# Patient Record
Sex: Female | Born: 1985 | Race: Black or African American | Hispanic: No | Marital: Married | Smoking: Never smoker
Health system: Southern US, Community
[De-identification: ages and names within clinical notes are randomized; demographics above are authoritative.]

## PROBLEM LIST (undated history)

## (undated) DIAGNOSIS — E079 Disorder of thyroid, unspecified: Secondary | ICD-10-CM

## (undated) DIAGNOSIS — Z8719 Personal history of other diseases of the digestive system: Secondary | ICD-10-CM

## (undated) DIAGNOSIS — E039 Hypothyroidism, unspecified: Secondary | ICD-10-CM

## (undated) DIAGNOSIS — I10 Essential (primary) hypertension: Secondary | ICD-10-CM

## (undated) HISTORY — DX: Morbid (severe) obesity due to excess calories: E66.01

## (undated) HISTORY — DX: Disorder of thyroid, unspecified: E07.9

## (undated) HISTORY — DX: Personal history of other diseases of the digestive system: Z87.19

## (undated) HISTORY — DX: Essential (primary) hypertension: I10

## (undated) HISTORY — PX: CHOLECYSTECTOMY: SHX55

## (undated) HISTORY — PX: OTHER SURGICAL HISTORY: SHX169

## (undated) HISTORY — PX: BARIATRIC SURGERY: SHX1103

## (undated) HISTORY — PX: HAND SURGERY: SHX662

---

## 2006-06-16 ENCOUNTER — Encounter: Payer: Self-pay | Admitting: Maternal & Fetal Medicine

## 2006-07-14 ENCOUNTER — Encounter: Payer: Self-pay | Admitting: Maternal & Fetal Medicine

## 2006-09-26 ENCOUNTER — Observation Stay: Payer: Self-pay

## 2007-01-01 ENCOUNTER — Inpatient Hospital Stay: Payer: Self-pay | Admitting: Unknown Physician Specialty

## 2007-08-25 ENCOUNTER — Emergency Department: Payer: Self-pay | Admitting: Internal Medicine

## 2007-10-09 ENCOUNTER — Ambulatory Visit: Payer: Self-pay | Admitting: General Surgery

## 2007-10-13 ENCOUNTER — Ambulatory Visit: Payer: Self-pay | Admitting: General Surgery

## 2011-05-22 ENCOUNTER — Emergency Department: Payer: Self-pay | Admitting: Emergency Medicine

## 2012-05-06 ENCOUNTER — Encounter: Payer: Self-pay | Admitting: Cardiovascular Disease

## 2012-05-08 ENCOUNTER — Ambulatory Visit (INDEPENDENT_AMBULATORY_CARE_PROVIDER_SITE_OTHER): Payer: BC Managed Care – PPO | Admitting: Cardiovascular Disease

## 2012-05-08 ENCOUNTER — Encounter: Payer: Self-pay | Admitting: Cardiovascular Disease

## 2012-05-08 VITALS — BP 142/92 | HR 77 | Ht 63.0 in | Wt 303.8 lb

## 2012-05-08 DIAGNOSIS — R011 Cardiac murmur, unspecified: Secondary | ICD-10-CM | POA: Insufficient documentation

## 2012-05-08 DIAGNOSIS — E669 Obesity, unspecified: Secondary | ICD-10-CM

## 2012-05-08 DIAGNOSIS — I10 Essential (primary) hypertension: Secondary | ICD-10-CM

## 2012-05-08 HISTORY — DX: Morbid (severe) obesity due to excess calories: E66.01

## 2012-05-08 NOTE — Assessment & Plan Note (Signed)
We have encouraged continued exercise, careful diet management in an effort to lose weight. 

## 2012-05-08 NOTE — Assessment & Plan Note (Signed)
She is concerned about hypertrophic cardiomyopathy. Other relatives have been diagnosed with this. Many of whom have passed away or have defibrillator placed. We have ordered an echocardiogram to exclude this diagnosis.

## 2012-05-08 NOTE — Progress Notes (Signed)
Patient ID: Deborah Munoz, female    DOB: 12-Aug-1986, 26 y.o.   MRN: [id number]  HPI Comments: Deborah Munoz is a very pleasant 76 her old woman with a history of hypertension, hypothyroidism, obesity, who is a patient of Dr. Elnita Maxwell Whicker/Dr. Dossie Arbour, who presents for evaluation of possible HOCM.  She reports that she has a strong family history of hypertrophic cardiomyopathy. 2 of her aunts died at an early age, one in her late 43s, one in her early 62s. Her mothers brother was diagnosed with cardiomyopathy and had a defibrillator placed. In addition, her father died at age 4, grandmother had heart attack at an early age.   She reports that her blood pressure at home is typically in the 130 range over 70. No significant shortness of breath or edema. She has no new health issues. She is caring for a 27-year-old child and a new 3-month-old child. No significant complications during pregnancy.  Notes indicate history of gallstones.  Edema in 2006 and was started on a diuretic at that time. Severe abdominal pain in December 2008 indicating cholecystectomy by laparoscopic means.  EKG shows normal sinus rhythm with rate 77 beats per minute with rare APC   Outpatient Encounter Prescriptions as of 05/08/2012  Medication Sig Dispense Refill  . hydrochlorothiazide (HYDRODIURIL) 25 MG tablet Take 25 mg by mouth daily.      Marland Kitchen levothyroxine (SYNTHROID, LEVOTHROID) 100 MCG tablet Take 100 mcg by mouth daily.      . metoprolol succinate (TOPROL-XL) 25 MG 24 hr tablet Take 25 mg by mouth daily.      . Multiple Vitamin (MULTIVITAMIN) tablet Take 1 tablet by mouth daily.         Review of Systems  Constitutional: Negative.   HENT: Negative.   Eyes: Negative.   Respiratory: Negative.   Cardiovascular: Positive for leg swelling.  Gastrointestinal: Negative.   Musculoskeletal: Negative.   Skin: Negative.   Neurological: Negative.   Hematological: Negative.   Psychiatric/Behavioral: Negative.   All  other systems reviewed and are negative.    BP 142/92  Pulse 77  Ht 5\' 3"  (1.6 m)  Wt 303 lb 12 oz (137.78 kg)  BMI 53.81 kg/m2  Physical Exam  Nursing note and vitals reviewed. Constitutional: She is oriented to person, place, and time. She appears well-developed and well-nourished.  HENT:  Head: Normocephalic.  Nose: Nose normal.  Mouth/Throat: Oropharynx is clear and moist.  Eyes: Conjunctivae are normal. Pupils are equal, round, and reactive to light.  Neck: Normal range of motion. Neck supple. No JVD present.  Cardiovascular: Normal rate, regular rhythm, S1 normal, S2 normal and intact distal pulses.  Exam reveals no gallop and no friction rub.   Murmur heard.  Systolic murmur is present with a grade of 1/6  Pulmonary/Chest: Effort normal and breath sounds normal. No respiratory distress. She has no wheezes. She has no rales. She exhibits no tenderness.  Abdominal: Soft. Bowel sounds are normal. She exhibits no distension. There is no tenderness.  Musculoskeletal: Normal range of motion. She exhibits no edema and no tenderness.  Lymphadenopathy:    She has no cervical adenopathy.  Neurological: She is alert and oriented to person, place, and time. Coordination normal.  Skin: Skin is warm and dry. No rash noted. No erythema.  Psychiatric: She has a normal mood and affect. Her behavior is normal. Judgment and thought content normal.         Assessment and Plan

## 2012-05-08 NOTE — Patient Instructions (Addendum)
You are doing well. No medication changes were made.  We will schedule you for an echocardiogram   Please call us if you have new issues that need to be addressed before your next appt.

## 2012-05-08 NOTE — Assessment & Plan Note (Signed)
Improved edema on her diuretic. Also on beta blocker with adequate blood pressure.

## 2012-05-12 ENCOUNTER — Other Ambulatory Visit: Payer: BC Managed Care – PPO

## 2012-06-02 ENCOUNTER — Other Ambulatory Visit (INDEPENDENT_AMBULATORY_CARE_PROVIDER_SITE_OTHER): Payer: BC Managed Care – PPO

## 2012-06-02 ENCOUNTER — Other Ambulatory Visit: Payer: Self-pay

## 2012-06-02 DIAGNOSIS — I10 Essential (primary) hypertension: Secondary | ICD-10-CM

## 2012-06-02 DIAGNOSIS — R011 Cardiac murmur, unspecified: Secondary | ICD-10-CM

## 2013-09-27 ENCOUNTER — Ambulatory Visit: Payer: Self-pay | Admitting: Hematology and Oncology

## 2013-09-27 ENCOUNTER — Ambulatory Visit: Payer: Self-pay | Admitting: Internal Medicine

## 2013-09-27 LAB — CBC CANCER CENTER
Basophil #: 0.1 x10 3/mm (ref 0.0–0.1)
Basophil %: 0.7 %
EOS ABS: 0.3 x10 3/mm (ref 0.0–0.7)
EOS PCT: 4.1 %
HCT: 38 % (ref 35.0–47.0)
HGB: 12.3 g/dL (ref 12.0–16.0)
LYMPHS ABS: 1.9 x10 3/mm (ref 1.0–3.6)
LYMPHS PCT: 24.1 %
MCH: 24.1 pg — AB (ref 26.0–34.0)
MCHC: 32.3 g/dL (ref 32.0–36.0)
MCV: 75 fL — ABNORMAL LOW (ref 80–100)
Monocyte #: 0.6 x10 3/mm (ref 0.2–0.9)
Monocyte %: 7.9 %
Neutrophil #: 4.9 x10 3/mm (ref 1.4–6.5)
Neutrophil %: 63.2 %
PLATELETS: 341 x10 3/mm (ref 150–440)
RBC: 5.1 10*6/uL (ref 3.80–5.20)
RDW: 15.3 % — ABNORMAL HIGH (ref 11.5–14.5)
WBC: 7.7 x10 3/mm (ref 3.6–11.0)

## 2013-10-24 ENCOUNTER — Ambulatory Visit: Payer: Self-pay | Admitting: Internal Medicine

## 2015-03-24 ENCOUNTER — Ambulatory Visit: Payer: Self-pay | Admitting: Family Medicine

## 2015-03-29 ENCOUNTER — Ambulatory Visit (INDEPENDENT_AMBULATORY_CARE_PROVIDER_SITE_OTHER): Payer: BC Managed Care – PPO | Admitting: Family Medicine

## 2015-03-29 ENCOUNTER — Encounter: Payer: Self-pay | Admitting: Family Medicine

## 2015-03-29 VITALS — BP 132/85 | HR 89 | Temp 99.3°F | Wt 289.0 lb

## 2015-03-29 DIAGNOSIS — F909 Attention-deficit hyperactivity disorder, unspecified type: Secondary | ICD-10-CM

## 2015-03-29 DIAGNOSIS — I10 Essential (primary) hypertension: Secondary | ICD-10-CM | POA: Diagnosis not present

## 2015-03-29 DIAGNOSIS — F900 Attention-deficit hyperactivity disorder, predominantly inattentive type: Secondary | ICD-10-CM | POA: Diagnosis not present

## 2015-03-29 DIAGNOSIS — D239 Other benign neoplasm of skin, unspecified: Secondary | ICD-10-CM | POA: Diagnosis not present

## 2015-03-29 NOTE — Assessment & Plan Note (Signed)
Benign.   monitor.

## 2015-03-29 NOTE — Progress Notes (Signed)
BP 132/85 mmHg  Pulse 89  Temp(Src) 99.3 F (37.4 C)  Wt 289 lb (131.09 kg)  SpO2 97%  LMP 03/07/2015 (Exact Date)   Subjective:    Patient ID: Deborah Munoz, female    DOB: 1985-11-01, 29 y.o.   MRN: [id number]  HPI: Deborah Munoz is a 29 y.o. female  Chief Complaint  Patient presents with  . OTHER    mole on her back  . ADHD    starting back school in fall, history of it in the past    Place on the upper back; 3-4 months; not getting larger; not painful; no discharge; has a skin tag on the left side neck, fallen off and grew back  Blood pressure today was high; checks once a month or so [facility]; usually pretty good; thinks it was high because she was slinging boxes all day  She is going to study psychology in the fall; [employer] for this semester and then will decide where to finish; she used to take Strattera; wonders if there is anything; she was on 80 mg in 2011 and 2013 for ADHD; her son has ADHD; patient is not very organized  She wants her uterus taken out; she was told by her gynecologist that she can't have a hysterectomy for contraception  Relevant past medical, surgical, family and social history reviewed and updated as indicated. Interim medical history since our last visit reviewed. Allergies and medications reviewed and updated.  Review of Systems  Per HPI unless specifically indicated above     Objective:    BP 132/85 mmHg  Pulse 89  Temp(Src) 99.3 F (37.4 C)  Wt 289 lb (131.09 kg)  SpO2 97%  LMP 03/07/2015 (Exact Date)  Wt Readings from Last 3 Encounters:  03/29/15 289 lb (131.09 kg)  05/08/12 303 lb 12 oz (137.78 kg)   Body mass index is 51.21 kg/(m^2).  Physical Exam  Constitutional: She appears well-developed and well-nourished.  Morbidly obese  Cardiovascular: Normal rate and regular rhythm.   Pulmonary/Chest: Effort normal and breath sounds normal.  Neurological: She is alert.  Skin: Skin is warm and dry.  Firm fibrous 5 mm nummular  papular lesion upper back right side; hyperpigmented; slight puckering in center when pressed from the sides    Results for orders placed or performed in visit on 09/27/13  [place]  Result Value Ref Range   WBC 7.7 3.6-11.0 x10 3/mm    RBC 5.10 3.80-5.20 x10 6/mm    HGB 12.3 12.0-16.0 g/dL   HCT 38.0 35.0-47.0 %   MCV 75 (L) 80-100 fL   MCH 24.1 (L) 26.0-34.0 pg   MCHC 32.3 32.0-36.0 g/dL   RDW 15.3 (H) 11.5-14.5 %   Platelet 341 150-440 x10 3/mm    Neutrophil % 63.2 %   Lymphocyte % 24.1 %   Monocyte % 7.9 %   Eosinophil % 4.1 %   Basophil % 0.7 %   Neutrophil # 4.9 1.4-6.5 x10 3/mm    Lymphocyte # 1.9 1.0-3.6 x10 3/mm    Monocyte # 0.6 0.2-0.9 x10 3/mm    Eosinophil # 0.3 0.0-0.7 x10 3/mm    Basophil # 0.1 0.0-0.1 x10 3/mm       Assessment & Plan:   Problem List Items Addressed This Visit      Cardiovascular and Mediastinum   Hypertension    Recheck BP 132/85; better but not ideal; discussed risk of ADHD meds; will have her try weight loss and  DASH guidelines        Musculoskeletal and Integument   Dermatofibroma - Primary    Benign; monitor        Other   Morbid obesity    Continue to work with personal trainer, smart eating, activity      Adult ADHD    Per patient report and prior records; I did not do full history today; explained risks associated with stimulant meds such as heart attack and stroke; would rather her read [place] and [place] and work with life coach; she will return in one month if she still wants to consider medicine          Follow up plan: Return in about 1 month (around 04/29/2015) for adhd.

## 2015-03-29 NOTE — Assessment & Plan Note (Signed)
Recheck BP 132/85; better but not ideal; discussed risk of ADHD meds; will have her try weight loss and DASH guidelines

## 2015-03-29 NOTE — Assessment & Plan Note (Signed)
Continue to work with Physiological scientist, smart eating, activity

## 2015-03-29 NOTE — Assessment & Plan Note (Signed)
Per patient report and prior records; I did not do full history today; explained risks associated with stimulant meds such as heart attack and stroke; would rather her read [place] and [place] and work with life coach; she will return in one month if she still wants to consider medicine

## 2015-03-29 NOTE — Patient Instructions (Addendum)
I'll encourage you to read Answers to Distraction by [facility] and [place] before we consider medication Try O.H.I.O = only handle it once Try Mozart or Vivaldi Check out a coach on Psychology Today Let's keep an eye on the mole and if it grows or changes or gets painful, let's remove that Keep working with your personal trainer and eating well Your goal blood pressure is less than 120/80 ideally, but at least needs to be under 140/90 Try to follow the DASH guidelines (DASH stands for Dietary Approaches to Stop Hypertension) Try to limit the sodium in your diet.  Ideally, consume less than 1.5 grams (less than 1,500mg ) per day. Do not add salt when cooking or at the table.  Check the sodium amount on labels when shopping, and choose items lower in sodium when given a choice. Avoid or limit foods that already contain a lot of sodium. Eat a diet rich in fruits and vegetables and whole grains.  DASH Eating Plan DASH stands for "Dietary Approaches to Stop Hypertension." The DASH eating plan is a healthy eating plan that has been shown to reduce high blood pressure (hypertension). Additional health benefits may include reducing the risk of type 2 diabetes mellitus, heart disease, and stroke. The DASH eating plan may also help with weight loss. WHAT DO I NEED TO KNOW ABOUT THE DASH EATING PLAN? For the DASH eating plan, you will follow these general guidelines:  Choose foods with a percent daily value for sodium of less than 5% (as listed on the food label).  Use salt-free seasonings or herbs instead of table salt or sea salt.  Check with your health care provider or pharmacist before using salt substitutes.  Eat lower-sodium products, often labeled as "lower sodium" or "no salt added."  Eat fresh foods.  Eat more vegetables, fruits, and low-fat dairy products.  Choose whole grains. Look for the word "whole" as the first word in the ingredient list.  Choose fish and skinless chicken or [place]  more often than red meat. Limit fish, poultry, and meat to 6 oz (170 g) each day.  Limit sweets, desserts, sugars, and sugary drinks.  Choose heart-healthy fats.  Limit cheese to 1 oz (28 g) per day.  Eat more home-cooked food and less restaurant, buffet, and fast food.  Limit fried foods.  Cook foods using methods other than frying.  Limit canned vegetables. If you do use them, rinse them well to decrease the sodium.  When eating at a restaurant, ask that your food be prepared with less salt, or no salt if possible. WHAT FOODS CAN I EAT? Seek help from a dietitian for individual calorie needs. Grains Whole grain or whole wheat bread. Brown rice. Whole grain or whole wheat pasta. Quinoa, bulgur, and whole grain cereals. Low-sodium cereals. Corn or whole wheat flour tortillas. Whole grain cornbread. Whole grain crackers. Low-sodium crackers. Vegetables Fresh or frozen vegetables (raw, steamed, roasted, or grilled). Low-sodium or reduced-sodium tomato and vegetable juices. Low-sodium or reduced-sodium tomato sauce and paste. Low-sodium or reduced-sodium canned vegetables.  Fruits All fresh, canned (in natural juice), or frozen fruits. Meat and Other Protein Products Ground beef (85% or leaner), grass-fed beef, or beef trimmed of fat. Skinless chicken or [place]. Ground chicken or [place]. Pork trimmed of fat. All fish and seafood. Eggs. Dried beans, peas, or lentils. Unsalted nuts and seeds. Unsalted canned beans. Dairy Low-fat dairy products, such as skim or 1% milk, 2% or reduced-fat cheeses, low-fat ricotta or cottage cheese, or plain low-fat  yogurt. Low-sodium or reduced-sodium cheeses. Fats and Oils Tub margarines without trans fats. Light or reduced-fat mayonnaise and salad dressings (reduced sodium). Avocado. Safflower, olive, or canola oils. Natural peanut or almond butter. Other Unsalted popcorn and pretzels. The items listed above may not be a complete list of recommended foods  or beverages. Contact your dietitian for more options. WHAT FOODS ARE NOT RECOMMENDED? Grains White bread. White pasta. White rice. Refined cornbread. Bagels and croissants. Crackers that contain trans fat. Vegetables Creamed or fried vegetables. Vegetables in a cheese sauce. Regular canned vegetables. Regular canned tomato sauce and paste. Regular tomato and vegetable juices. Fruits Dried fruits. Canned fruit in light or heavy syrup. Fruit juice. Meat and Other Protein Products Fatty cuts of meat. Ribs, chicken wings, bacon, sausage, bologna, salami, chitterlings, fatback, hot dogs, bratwurst, and packaged luncheon meats. Salted nuts and seeds. Canned beans with salt. Dairy Whole or 2% milk, cream, half-and-half, and cream cheese. Whole-fat or sweetened yogurt. Full-fat cheeses or blue cheese. Nondairy creamers and whipped toppings. Processed cheese, cheese spreads, or cheese curds. Condiments Onion and garlic salt, seasoned salt, table salt, and sea salt. Canned and packaged gravies. Worcestershire sauce. Tartar sauce. Barbecue sauce. Teriyaki sauce. Soy sauce, including reduced sodium. Steak sauce. Fish sauce. Oyster sauce. Cocktail sauce. Horseradish. Ketchup and mustard. Meat flavorings and tenderizers. Bouillon cubes. Hot sauce. Tabasco sauce. Marinades. Taco seasonings. Relishes. Fats and Oils Butter, stick margarine, lard, shortening, ghee, and bacon fat. Coconut, palm kernel, or palm oils. Regular salad dressings. Other Pickles and olives. Salted popcorn and pretzels. The items listed above may not be a complete list of foods and beverages to avoid. Contact your dietitian for more information. WHERE CAN I FIND MORE INFORMATION? National Heart, Lung, and Blood Institute: travelstabloid.com Document Released: 08/29/2011 Document Revised: 01/24/2014 Document Reviewed: 07/14/2013 [facility] Patient Information 2015 [place], [place]. This information is not  intended to replace advice given to you by your health care provider. Make sure you discuss any questions you have with your health care provider.

## 2015-05-04 ENCOUNTER — Ambulatory Visit (INDEPENDENT_AMBULATORY_CARE_PROVIDER_SITE_OTHER): Payer: BC Managed Care – PPO | Admitting: Family Medicine

## 2015-05-04 ENCOUNTER — Encounter: Payer: Self-pay | Admitting: Family Medicine

## 2015-05-04 VITALS — BP 132/85 | HR 97 | Temp 98.8°F | Wt 290.0 lb

## 2015-05-04 DIAGNOSIS — Z0000 Encounter for general adult medical examination without abnormal findings: Secondary | ICD-10-CM | POA: Diagnosis not present

## 2015-05-04 DIAGNOSIS — F909 Attention-deficit hyperactivity disorder, unspecified type: Secondary | ICD-10-CM

## 2015-05-04 DIAGNOSIS — E559 Vitamin D deficiency, unspecified: Secondary | ICD-10-CM

## 2015-05-04 DIAGNOSIS — F900 Attention-deficit hyperactivity disorder, predominantly inattentive type: Secondary | ICD-10-CM

## 2015-05-04 DIAGNOSIS — R718 Other abnormality of red blood cells: Secondary | ICD-10-CM | POA: Diagnosis not present

## 2015-05-04 DIAGNOSIS — R946 Abnormal results of thyroid function studies: Secondary | ICD-10-CM

## 2015-05-04 DIAGNOSIS — I10 Essential (primary) hypertension: Secondary | ICD-10-CM

## 2015-05-04 DIAGNOSIS — E039 Hypothyroidism, unspecified: Secondary | ICD-10-CM | POA: Insufficient documentation

## 2015-05-04 NOTE — Patient Instructions (Signed)
We'll check labs today and contact you with the results Do contact the foundation about your question Keep working on the tips and tricks for focus Return in 3-6 months

## 2015-05-04 NOTE — Progress Notes (Signed)
BP 132/85 mmHg  Pulse 97  Temp(Src) 98.8 F (37.1 C)  Wt 290 lb (131.543 kg)  SpO2 97%  LMP  (LMP Unknown)   Subjective:    Patient ID: Deborah Munoz, female    DOB: 12/19/1985, 29 y.o.   MRN: [id number]  HPI: Deborah Munoz is a 29 y.o. female  Chief Complaint  Patient presents with  . ADHD  . 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 Interim medical history since our last visit reviewed. Allergies and medications reviewed and updated.  Review of Systems Per HPI unless specifically indicated above     Objective:    BP 132/85 mmHg  Pulse 97  Temp(Src) 98.8 F (37.1 C)  Wt 290 lb (131.543 kg)  SpO2 97%  LMP  (LMP Unknown)  Wt Readings from Last 3 Encounters:  05/04/15 290 lb (131.543 kg)  03/29/15 289 lb (131.09 kg)  05/08/12 303 lb 12 oz (137.78 kg)    Physical Exam  Constitutional: She appears well-developed and well-nourished. No distress.  Eyes: EOM are normal. No  scleral icterus.  Neck: No thyromegaly present.  Cardiovascular: Normal rate.   Pulmonary/Chest: Effort normal.  Abdominal: She exhibits no distension.  Skin: No pallor.  Psychiatric: She has a normal mood and affect. Her behavior is normal. Judgment and thought content normal.      Assessment & Plan:   Problem List Items Addressed This Visit      Cardiovascular and Mediastinum   Hypertension - Primary    Fair control today; weight loss would be helpful        Other   Morbid obesity    Patient has struggled with weight; has previously worked with Physiological scientist, but work demands have kept her from this lately; hopeful she will get back in the swing with diet and exercise      Adult ADHD    I am hopeful that she'll be able to use some strategies to help cope with attention difficulties rather than use stimulants at this time; as she loses weight and gets her blood pressure down, stimulant use might be something we can readdress      Abnormal thyroid function test    Check TSH, adjust med if needed      Relevant Orders   TSH (Completed)   Vitamin D deficiency    Check level today, supplement if needed  Relevant Orders   Vit D  25 hydroxy (rtn osteoporosis monitoring) (Completed)   RBC microcytosis    Check cbc and ferritin      Relevant Orders   CBC (Completed)   Ferritin (Completed)    Other Visit Diagnoses    Preventative health care        labs for health maintenance collected as well today; no physical exam done though, just labs ordered    Relevant Orders    Comprehensive metabolic panel (Completed)       We discussed her desire to be a uterine donor; I gave her info to [place] group to see if they can provide her on relevant information on research, donation, other questions she has  Follow up plan: No Follow-up on file.  Left it up to her, either 3 or 6 month follow-up; sooner f/u if she thinks it would hold her accountable for weight loss; 6 months  for hypertension, multiple other issues

## 2015-05-05 ENCOUNTER — Encounter: Payer: Self-pay | Admitting: Family Medicine

## 2015-05-05 LAB — CBC
Hematocrit: 38.3 % (ref 34.0–46.6)
Hemoglobin: 12.9 g/dL (ref 11.1–15.9)
MCH: 24.8 pg — ABNORMAL LOW (ref 26.6–33.0)
MCHC: 33.7 g/dL (ref 31.5–35.7)
MCV: 74 fL — AB (ref 79–97)
Platelets: 356 10*3/uL (ref 150–379)
RBC: 5.21 x10E6/uL (ref 3.77–5.28)
RDW: 15.1 % (ref 12.3–15.4)
WBC: 9.7 10*3/uL (ref 3.4–10.8)

## 2015-05-05 LAB — COMPREHENSIVE METABOLIC PANEL
A/G RATIO: 1.4 (ref 1.1–2.5)
ALBUMIN: 4.2 g/dL (ref 3.5–5.5)
ALT: 9 IU/L (ref 0–32)
AST: 12 IU/L (ref 0–40)
Alkaline Phosphatase: 66 IU/L (ref 39–117)
BUN/Creatinine Ratio: 20 (ref 8–20)
BUN: 15 mg/dL (ref 6–20)
Bilirubin Total: <0.2 mg/dL (ref 0.0–1.2)
CALCIUM: 8.9 mg/dL (ref 8.7–10.2)
CHLORIDE: 96 mmol/L — AB (ref 97–108)
CO2: 25 mmol/L (ref 18–29)
Creatinine, Ser: 0.74 mg/dL (ref 0.57–1.00)
GFR, EST AFRICAN AMERICAN: 127 mL/min/{1.73_m2} (ref >59)
GFR, EST NON AFRICAN AMERICAN: 110 mL/min/{1.73_m2} (ref >59)
GLOBULIN, TOTAL: 2.9 g/dL (ref 1.5–4.5)
GLUCOSE: 91 mg/dL (ref 65–99)
POTASSIUM: 3.7 mmol/L (ref 3.5–5.2)
Sodium: 136 mmol/L (ref 134–144)
Total Protein: 7.1 g/dL (ref 6.0–8.5)

## 2015-05-05 LAB — VITAMIN D 25 HYDROXY (VIT D DEFICIENCY, FRACTURES): Vit D, 25-Hydroxy: 33.3 ng/mL (ref 30.0–100.0)

## 2015-05-05 LAB — TSH: TSH: 2.52 u[IU]/mL (ref 0.450–4.500)

## 2015-05-05 LAB — FERRITIN: FERRITIN: 178 ng/mL — AB (ref 15–150)

## 2015-05-06 DIAGNOSIS — R718 Other abnormality of red blood cells: Secondary | ICD-10-CM | POA: Insufficient documentation

## 2015-05-06 NOTE — Assessment & Plan Note (Signed)
Check TSH, adjust med if needed

## 2015-05-06 NOTE — Assessment & Plan Note (Signed)
Fair control today; weight loss would be helpful

## 2015-05-06 NOTE — Assessment & Plan Note (Signed)
I am hopeful that she'll be able to use some strategies to help cope with attention difficulties rather than use stimulants at this time; as she loses weight and gets her blood pressure down, stimulant use might be something we can readdress

## 2015-05-06 NOTE — Assessment & Plan Note (Signed)
Check level today, supplement if needed 

## 2015-05-06 NOTE — Assessment & Plan Note (Signed)
Patient has struggled with weight; has previously worked with Physiological scientist, but work demands have kept her from this lately; hopeful she will get back in the swing with diet and exercise

## 2015-05-06 NOTE — Assessment & Plan Note (Signed)
Check cbc and ferritin 

## 2015-06-13 ENCOUNTER — Telehealth: Payer: Self-pay

## 2015-06-13 DIAGNOSIS — R0683 Snoring: Secondary | ICD-10-CM

## 2015-06-13 DIAGNOSIS — R400 Somnolence: Secondary | ICD-10-CM

## 2015-06-13 DIAGNOSIS — E669 Obesity, unspecified: Secondary | ICD-10-CM

## 2015-06-13 DIAGNOSIS — R29818 Other symptoms and signs involving the nervous system: Secondary | ICD-10-CM

## 2015-06-13 DIAGNOSIS — G479 Sleep disorder, unspecified: Secondary | ICD-10-CM

## 2015-06-13 NOTE — Telephone Encounter (Signed)
Please add symptoms to problem list, link to referral, and send; I entered referral to pulm (sleep specialist) but didn't have symptoms (snoring, daytime fatigue, daytime somnolence, headaches, witnessed apnea, etc.) Just list those, link, and route; thanks!

## 2015-06-13 NOTE — Telephone Encounter (Signed)
Patient notified. She did feeling great checklist. She marked 12 out of 14 yes. She falls asleep while driving, never feels refreshed, falls asleep easily watching TV, snoring, obesity.

## 2015-06-13 NOTE — Telephone Encounter (Signed)
Patient would like a referral to a sleep specialist for a sleep study to eval for sleep apnea.

## 2015-07-20 DIAGNOSIS — G4733 Obstructive sleep apnea (adult) (pediatric): Secondary | ICD-10-CM | POA: Insufficient documentation

## 2015-08-08 ENCOUNTER — Ambulatory Visit: Payer: BC Managed Care – PPO | Attending: Specialist

## 2015-08-08 DIAGNOSIS — G4733 Obstructive sleep apnea (adult) (pediatric): Secondary | ICD-10-CM | POA: Insufficient documentation

## 2015-08-16 ENCOUNTER — Ambulatory Visit (INDEPENDENT_AMBULATORY_CARE_PROVIDER_SITE_OTHER): Payer: BC Managed Care – PPO | Admitting: Family Medicine

## 2015-08-16 ENCOUNTER — Encounter: Payer: Self-pay | Admitting: Family Medicine

## 2015-08-16 VITALS — BP 125/81 | HR 92 | Temp 98.6°F | Ht 63.5 in | Wt 290.0 lb

## 2015-08-16 DIAGNOSIS — S29011A Strain of muscle and tendon of front wall of thorax, initial encounter: Secondary | ICD-10-CM

## 2015-08-16 MED ORDER — CYCLOBENZAPRINE HCL 10 MG PO TABS: 10.0000 mg | ORAL_TABLET | Freq: Three times a day (TID) | ORAL | Status: DC | PRN

## 2015-08-16 MED ORDER — MELOXICAM 15 MG PO TABS: 15.0000 mg | ORAL_TABLET | Freq: Every day | ORAL | Status: DC

## 2015-08-16 NOTE — Progress Notes (Signed)
BP 125/81 mmHg  Pulse 92  Temp(Src) 98.6 F (37 C)  Ht 5' 3.5" (1.613 m)  Wt 290 lb (131.543 kg)  BMI 50.56 kg/m2  SpO2 97%  LMP  (LMP Unknown)   Subjective:    Patient ID: Deborah Munoz, female    DOB: 19-Apr-1986, 29 y.o.   MRN: [id number]  HPI: Deborah Munoz is a 29 y.o. female  Chief Complaint  Patient presents with  . Back Pain    mid back, all the way across. pain has eased some   patient this morning was doing well using the restroom just after getting up from bed with no complaints or problems on standing from the toilet had severe mid back pain grab her with a sharp catch and severe pain. This is not gone up or down stairs, all across her back into the sides patient had a mild cold but nothing special.  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   Relevant past medical, surgical, family and social history reviewed and updated as indicated. Interim medical history since our last visit reviewed. Allergies and medications reviewed and updated.  Review of Systems  Constitutional: Negative.   Respiratory: Negative.   Cardiovascular: Negative.     Per HPI unless specifically indicated above     Objective:    BP 125/81 mmHg  Pulse 92  Temp(Src) 98.6 F (37 C)  Ht 5' 3.5" (1.613 m)  Wt 290 lb (131.543 kg)  BMI 50.56 kg/m2  SpO2 97%  LMP  (LMP Unknown)  Wt Readings from Last 3 Encounters:  08/16/15 290 lb (131.543 kg)  05/04/15 290 lb (131.543 kg)  03/29/15 289 lb (131.09 kg)    Physical Exam  Constitutional: She is oriented to person, place, and time. She appears well-developed and well-nourished. No distress.  HENT:  Head: Normocephalic and atraumatic.  Right Ear: Hearing normal.  Left Ear: Hearing normal.  Nose: Nose normal.  Eyes: Conjunctivae and lids are normal. Right eye exhibits no discharge. Left eye exhibits no discharge. No  scleral icterus.  Cardiovascular: Normal rate, regular rhythm and normal heart sounds.   Pulmonary/Chest: Effort normal and breath sounds normal. No respiratory distress.  Musculoskeletal: Normal range of motion.  Mid back area with soreness with twisting range of motion no spinal irritation inflammation or pain  Neurological: She is alert and oriented to person, place, and time.  Skin: Skin is intact. No rash noted.  Psychiatric: She has a normal mood and affect. Her speech is normal and behavior is normal. Judgment and thought content normal. Cognition and memory are normal.    Results for orders placed or performed in visit on 05/04/15  Vit D  25 hydroxy (rtn osteoporosis monitoring)  Result Value Ref Range   Vit D, 25-Hydroxy 33.3 30.0 - 100.0 ng/mL  TSH  Result Value Ref Range   TSH 2.520 0.450 - 4.500 uIU/mL  Comprehensive metabolic panel  Result Value Ref Range   Glucose 91 65 - 99 mg/dL   BUN 15 6 - 20 mg/dL   Creatinine, Ser 0.74 0.57 - 1.00 mg/dL   GFR calc non Af Amer 110 >59 mL/min/1.73   GFR calc Af Amer 127 >59 mL/min/1.73   BUN/Creatinine Ratio 20 8 - 20   Sodium 136 134 - 144 mmol/L   Potassium 3.7 3.5 - 5.2 mmol/L   Chloride 96 (L) 97 -  108 mmol/L   CO2 25 18 - 29 mmol/L   Calcium 8.9 8.7 - 10.2 mg/dL   Total Protein 7.1 6.0 - 8.5 g/dL   Albumin 4.2 3.5 - 5.5 g/dL   Globulin, Total 2.9 1.5 - 4.5 g/dL   Albumin/Globulin Ratio 1.4 1.1 - 2.5   Bilirubin Total <0.2 0.0 - 1.2 mg/dL   Alkaline Phosphatase 66 39 - 117 IU/L   AST 12 0 - 40 IU/L   ALT 9 0 - 32 IU/L  CBC  Result Value Ref Range   WBC 9.7 3.4 - 10.8 x10E3/uL   RBC 5.21 3.77 - 5.28 x10E6/uL   Hemoglobin 12.9 11.1 - 15.9 g/dL   Hematocrit 38.3 34.0 - 46.6 %   MCV 74 (L) 79 - 97 fL   MCH 24.8 (L) 26.6 - 33.0 pg   MCHC 33.7 31.5 - 35.7 g/dL   RDW 15.1 12.3 - 15.4 %   Platelets 356 150 - 379 x10E3/uL  Ferritin  Result Value Ref Range   Ferritin 178 (H) 15 - 150 ng/mL      Assessment & Plan:    Problem List Items Addressed This Visit      Musculoskeletal and Integument   Muscle strain of chest wall - Primary    Discussed stretching exercises proper posture Caution with use of Flexeril and drowsiness Prescription for meloxicam given If problems persist will need follow-up with Dr. Wynetta Emery.          Follow up plan: Return if symptoms worsen or fail to improve, for As scheduled.

## 2015-08-16 NOTE — Assessment & Plan Note (Signed)
Discussed stretching exercises proper posture Caution with use of Flexeril and drowsiness Prescription for meloxicam given If problems persist will need follow-up with Dr. Wynetta Emery.

## 2015-09-07 ENCOUNTER — Encounter: Payer: Self-pay | Admitting: Family Medicine

## 2015-09-07 ENCOUNTER — Ambulatory Visit (INDEPENDENT_AMBULATORY_CARE_PROVIDER_SITE_OTHER): Payer: BC Managed Care – PPO | Admitting: Family Medicine

## 2015-09-07 VITALS — BP 131/83 | HR 77 | Temp 98.2°F | Wt 290.0 lb

## 2015-09-07 DIAGNOSIS — E039 Hypothyroidism, unspecified: Secondary | ICD-10-CM

## 2015-09-07 DIAGNOSIS — G4710 Hypersomnia, unspecified: Secondary | ICD-10-CM | POA: Diagnosis not present

## 2015-09-07 DIAGNOSIS — R718 Other abnormality of red blood cells: Secondary | ICD-10-CM | POA: Diagnosis not present

## 2015-09-07 DIAGNOSIS — I10 Essential (primary) hypertension: Secondary | ICD-10-CM | POA: Diagnosis not present

## 2015-09-07 DIAGNOSIS — R400 Somnolence: Secondary | ICD-10-CM | POA: Insufficient documentation

## 2015-09-07 DIAGNOSIS — E559 Vitamin D deficiency, unspecified: Secondary | ICD-10-CM | POA: Diagnosis not present

## 2015-09-07 MED ORDER — LEVOTHYROXINE SODIUM 112 MCG PO TABS: 112.0000 ug | ORAL_TABLET | Freq: Every day | ORAL | Status: DC

## 2015-09-07 MED ORDER — HYDROCHLOROTHIAZIDE 25 MG PO TABS: 25.0000 mg | ORAL_TABLET | Freq: Every day | ORAL | Status: DC

## 2015-09-07 NOTE — Assessment & Plan Note (Signed)
Check cbc and ferritin, consider hemoglobinopathy panel

## 2015-09-07 NOTE — Assessment & Plan Note (Addendum)
Continue current meds; DASH guidelines; refills given; encouraged weight loss; I would love to see her successful with her weight loss efforts and be able to come off of her BP medicine

## 2015-09-07 NOTE — Assessment & Plan Note (Signed)
Never drive when tired; working with pulmonologist to evaluate for sleep apnea; heavy snoring; weight loss should help

## 2015-09-07 NOTE — Progress Notes (Signed)
BP 131/83 mmHg  Pulse 77  Temp(Src) 98.2 F (36.8 C)  Wt 290 lb (131.543 kg)  SpO2 97%  LMP  (LMP Unknown)   Subjective:    Patient ID: Deborah Munoz, female    DOB: May 11, 1986, 29 y.o.   MRN: [id number]  HPI: Deborah Munoz is a 29 y.o. female  Chief Complaint  Patient presents with  . Obesity    per last note in EPIC, patient says she's not sure what she's here for today.  Marland Kitchen Appointment    she will call [facility] and schedule her pap   She has high blood pressure; checking away from the doctor and her BP is about the same as here; she does watch a good diet in terms of salt; she uses sea salt; tries to limit fast food  Obesity; she works out twice a week now; jogs around the neighborhood; getting family and friends involved; doing a protein shake for breakfast; was doing a gallon of water a day before the protein shake, but has slacked off on that; she will get back on that, life got busy; she has made small changes and then the last few weeks, she has made bigger changes, working out; she loves bread, trying to eat more lean meat; she won't eat fast food stuff when she takes the boys out, she'll get the salad; being very conscious about making those changes; no thyroid problems in the family  She had mildly low MCV and MCH From May 04, 2015: WBC 3.4 - 10.8 x10E3/uL 9.7   RBC 3.77 - 5.28 x10E6/uL 5.21   Hemoglobin 11.1 - 15.9 g/dL 12.9   Hematocrit 34.0 - 46.6 % 38.3   MCV 79 - 97 fL 74 (L)   MCH 26.6 - 33.0 pg 24.8 (L)   MCHC 31.5 - 35.7 g/dL 33.7   RDW 12.3 - 15.4 % 15.1   Platelets 150 - 379 x10E3/uL 356        She is working with Dr. Raul Del for the sleep apnea testing  Flu shot UTD this season  She will [facility] someone [facility]  Relevant past medical, surgical, family and social history reviewed and updated as indicated; she is not aware of anyone in the family with thalassemia Interim medical history since our last visit  reviewed. Allergies and medications reviewed and updated.  Review of Systems Per HPI unless specifically indicated above     Objective:    BP 131/83 mmHg  Pulse 77  Temp(Src) 98.2 F (36.8 C)  Wt 290 lb (131.543 kg)  SpO2 97%  LMP  (LMP Unknown)  Wt Readings from Last 3 Encounters:  09/07/15 290 lb (131.543 kg)  08/16/15 290 lb (131.543 kg)  05/04/15 290 lb (131.543 kg)  body mass index is 50.56 kg/(m^2).  Physical Exam  Constitutional: She appears well-developed and well-nourished. No distress.  Morbidly obese; weight stable  Eyes: EOM are normal. No scleral icterus.  Neck: No thyromegaly present.  Cardiovascular: Normal rate.   Pulmonary/Chest: Effort normal.  Abdominal: She exhibits no distension.  Morbidly obese  Skin: No rash noted. She is not diaphoretic. No pallor.  Psychiatric: She has a normal mood and affect. Her behavior is normal. Judgment and thought content normal.  Very pleasant and cooperative   Results for orders placed or performed in visit on 05/04/15  Vit D  25 hydroxy (rtn osteoporosis monitoring)  Result Value Ref Range   Vit D, 25-Hydroxy 33.3 30.0 -  100.0 ng/mL  TSH  Result Value Ref Range   TSH 2.520 0.450 - 4.500 uIU/mL  Comprehensive metabolic panel  Result Value Ref Range   Glucose 91 65 - 99 mg/dL   BUN 15 6 - 20 mg/dL   Creatinine, Ser 0.74 0.57 - 1.00 mg/dL   GFR calc non Af Amer 110 >59 mL/min/1.73   GFR calc Af Amer 127 >59 mL/min/1.73   BUN/Creatinine Ratio 20 8 - 20   Sodium 136 134 - 144 mmol/L   Potassium 3.7 3.5 - 5.2 mmol/L   Chloride 96 (L) 97 - 108 mmol/L   CO2 25 18 - 29 mmol/L   Calcium 8.9 8.7 - 10.2 mg/dL   Total Protein 7.1 6.0 - 8.5 g/dL   Albumin 4.2 3.5 - 5.5 g/dL   Globulin, Total 2.9 1.5 - 4.5 g/dL   Albumin/Globulin Ratio 1.4 1.1 - 2.5   Bilirubin Total <0.2 0.0 - 1.2 mg/dL   Alkaline Phosphatase 66 39 - 117 IU/L   AST 12 0 - 40 IU/L   ALT 9 0 - 32 IU/L  CBC  Result Value Ref Range   WBC 9.7 3.4 -  10.8 x10E3/uL   RBC 5.21 3.77 - 5.28 x10E6/uL   Hemoglobin 12.9 11.1 - 15.9 g/dL   Hematocrit 38.3 34.0 - 46.6 %   MCV 74 (L) 79 - 97 fL   MCH 24.8 (L) 26.6 - 33.0 pg   MCHC 33.7 31.5 - 35.7 g/dL   RDW 15.1 12.3 - 15.4 %   Platelets 356 150 - 379 x10E3/uL  Ferritin  Result Value Ref Range   Ferritin 178 (H) 15 - 150 ng/mL      Assessment & Plan:   Problem List Items Addressed This Visit      Cardiovascular and Mediastinum   Hypertension - Primary (Chronic)    Continue current meds; DASH guidelines; refills given; encouraged weight loss; I would love to see her successful with her weight loss efforts and be able to come off of her BP medicine      Relevant Medications   hydrochlorothiazide (HYDRODIURIL) 25 MG tablet     Endocrine   Hypothyroidism    Noted in the past; however, most recent TSH is in the normal range, so I don't believe that her thyroid function is interfering with her ability to lose weight      Relevant Medications   levothyroxine (SYNTHROID, LEVOTHROID) 112 MCG tablet   Other Relevant Orders   TSH     Other   Morbid obesity (HCC) (Chronic)    Work on weight loss, refer to nutrition; check fasting lipids in the next week or few weeks; encouraged her to use a calorie counter to see what her true caloric intake is; I suspect that she is underestimating the number of calories she is taking in; encouraged her to keep up her activity; we can consider medicine in the future if all else fails, but I would really like for her to meet with nutritionist and then re-evaluate in 6 weeks; she agrees      Relevant Orders   Lipid Panel w/o Chol/HDL Ratio   Vitamin D deficiency    Reviewed, last level just above 30; continue 1000 iu daily to maintain level above 30, espeically with shorter darker days during winter      RBC microcytosis    Check cbc and ferritin, consider hemoglobinopathy panel      Relevant Orders   Amb ref to Medical Nutrition Therapy-MNT  CBC  with Differential/Platelet   Ferritin   Daytime somnolence    Never drive when tired; working with pulmonologist to evaluate for sleep apnea; heavy snoring; weight loss should help         Follow up plan: Return in about 6 weeks (around 10/19/2015).  Orders Placed This Encounter  Procedures  . Lipid Panel w/o Chol/HDL Ratio  . CBC with Differential/Platelet  . Ferritin  . TSH  . Amb ref to Medical Nutrition Therapy-MNT  *orders entered as "future" and she will return in the next week or two*  Meds ordered this encounter  Medications  . hydrochlorothiazide (HYDRODIURIL) 25 MG tablet    Sig: Take 1 tablet (25 mg total) by mouth daily.    Dispense:  30 tablet    Refill:  11  . levothyroxine (SYNTHROID, LEVOTHROID) 112 MCG tablet    Sig: Take 1 tablet (112 mcg total) by mouth daily.    Dispense:  30 tablet    Refill:  0   An after-visit summary was printed and given to the patient at [place].  Please see the patient instructions which may contain other information and recommendations beyond what is mentioned above in the assessment and plan.

## 2015-09-07 NOTE — Patient Instructions (Addendum)
We'll refer you to the nutritionist Do a full calorie count and activity tracker for one week to take with you to the nutritionist Return in the next week or few weeks for fasting labs Your goal blood pressure is less than 140 mmHg on top. Try to follow the DASH guidelines (DASH stands for Dietary Approaches to Stop Hypertension) Try to limit the sodium in your diet.  Ideally, consume less than 1.5 grams (less than 1,500mg ) per day. Do not add salt when cooking or at the table.  Check the sodium amount on labels when shopping, and choose items lower in sodium when given a choice. Avoid or limit foods that already contain a lot of sodium. Eat a diet rich in fruits and vegetables and whole grains. Return in 6 weeks for recheck of weight and decide on further therapy

## 2015-09-07 NOTE — Assessment & Plan Note (Addendum)
Work on weight loss, refer to nutrition; check fasting lipids in the next week or few weeks; encouraged her to use a calorie counter to see what her true caloric intake is; I suspect that she is underestimating the number of calories she is taking in; encouraged her to keep up her activity; we can consider medicine in the future if all else fails, but I would really like for her to meet with nutritionist and then re-evaluate in 6 weeks; she agrees

## 2015-09-09 NOTE — Assessment & Plan Note (Signed)
Noted in the past; however, most recent TSH is in the normal range, so I don't believe that her thyroid function is interfering with her ability to lose weight

## 2015-09-09 NOTE — Assessment & Plan Note (Signed)
Reviewed, last level just above 30; continue 1000 iu daily to maintain level above 30, espeically with shorter darker days during winter

## 2015-09-11 ENCOUNTER — Other Ambulatory Visit: Payer: BC Managed Care – PPO

## 2015-09-26 ENCOUNTER — Encounter: Payer: BC Managed Care – PPO | Attending: Family Medicine | Admitting: Dietician

## 2015-09-26 VITALS — Ht 63.0 in | Wt 295.0 lb

## 2015-09-26 DIAGNOSIS — E669 Obesity, unspecified: Secondary | ICD-10-CM | POA: Diagnosis not present

## 2015-09-26 NOTE — Patient Instructions (Signed)
   Control food portions, decrease slightly to aim for 1800 calories daily.   Keep up your regular exercise and healthy food choices.

## 2015-09-26 NOTE — Progress Notes (Signed)
Medical Nutrition Therapy: Visit start time: 1500  end time: 1600  Assessment:  Diagnosis: obesity, microcytosis Past medical history: hypothyroidism Psychosocial issues/ stress concerns: patient reports high stress level, mostly due to busy schedule. Preferred learning method:  . Auditory . Visual . Hands-on  Current weight: 295.0lbs  Height: 5'3" Medications, supplements: reviewed list in chart with patient Progress and evaluation: Patient reports recent lifestyle changes to eat more vegetables, less soda, decreased snacking and late eating, and increased exercise.          She voices frustration with lack of results so far.  Was working with Physiological scientist for a time and lost about 10lbs, with no diet change, but no longer with trainer; is exercising on her own.          Tracked intake with MyFitnessPal for 2 weeks and averaged 2100kcal (rec. Was 2570), but didn't continue due to lack of results.  Physical activity: cardiovascular exercise 30-60 minutes, 2-3 times per week  Dietary Intake:  Usual eating pattern includes 3 meals and 1 snacks per day. Dining out frequency: 4 meals per week.  Breakfast: spoon peanut butter or boiled egg upon awakening;        At work: Mini muffins today, or pretzels or cheerios, occ pb crackers, protein granola bar, was drinking protein shakes with pineapple juice Snack: usually none Lunch: small portion leftovers (spaghetti), or [place] sandwich Snack: fruit or cheese prior to dinner sometimes.  Supper: salad; spaghetti squash with alfredo Snack: usually none, busy with sons Beverages: water 3 - 32oz bottles daily + more at work.   Nutrition Care Education: Topics covered: weight management Basic nutrition: basic food groups, appropriate nutrient balance, appropriate meal and snack schedule, general nutrition guidelines    Weight control: behavioral changes for weight loss; 1800kcal meal plan for weight loss; appropriate food portions and balance  of nutrients in meals and snacks.    Nutritional Diagnosis:  [place]-3.3 Overweight/obesity As related to history of excess caloric intake, hypothyroidism.  As evidenced by patient report, high BMI.  Intervention: Instruction as noted above.   Will address iron intake at follow-up visit; patient concerned with weight loss difficulty at this visit.   Set goals with patient input.  Education Materials given:  . Food lists/ Planning A Balanced Meal . Sample meal pattern/ menus . Snacking handout . Goals/ instructions  Learner/ who was taught:  . Patient   Level of understanding: Marland Kitchen Verbalizes/ demonstrates competency  Demonstrated degree of understanding via:   Teach back Learning barriers: . None   Willingness to learn/ readiness for change: . Eager, change in progress   Monitoring and Evaluation:  Dietary intake, exercise, microcytosis, and body weight      follow up: 10/26/15

## 2015-10-20 ENCOUNTER — Ambulatory Visit (INDEPENDENT_AMBULATORY_CARE_PROVIDER_SITE_OTHER): Payer: BC Managed Care – PPO | Admitting: Family Medicine

## 2015-10-20 ENCOUNTER — Encounter: Payer: Self-pay | Admitting: Family Medicine

## 2015-10-20 VITALS — BP 145/92 | HR 88 | Temp 98.5°F | Ht 62.5 in | Wt 290.0 lb

## 2015-10-20 DIAGNOSIS — B360 Pityriasis versicolor: Secondary | ICD-10-CM

## 2015-10-20 DIAGNOSIS — L301 Dyshidrosis [pompholyx]: Secondary | ICD-10-CM

## 2015-10-20 DIAGNOSIS — E039 Hypothyroidism, unspecified: Secondary | ICD-10-CM

## 2015-10-20 DIAGNOSIS — R718 Other abnormality of red blood cells: Secondary | ICD-10-CM

## 2015-10-20 DIAGNOSIS — I10 Essential (primary) hypertension: Secondary | ICD-10-CM | POA: Diagnosis not present

## 2015-10-20 DIAGNOSIS — F900 Attention-deficit hyperactivity disorder, predominantly inattentive type: Secondary | ICD-10-CM

## 2015-10-20 DIAGNOSIS — F909 Attention-deficit hyperactivity disorder, unspecified type: Secondary | ICD-10-CM

## 2015-10-20 MED ORDER — CLOBETASOL PROPIONATE 0.05 % EX CREA: 1.0000 "application " | TOPICAL_CREAM | Freq: Two times a day (BID) | CUTANEOUS | Status: DC

## 2015-10-20 NOTE — Progress Notes (Signed)
BP 145/92 mmHg  Pulse 88  Temp(Src) 98.5 F (36.9 C)  Ht 5' 2.5" (1.588 m)  Wt 290 lb (131.543 kg)  BMI 52.16 kg/m2  SpO2 98%   Subjective:    Patient ID: Deborah Munoz, female    DOB: [birth year], 30 y.o.   MRN: [id number]  HPI: Deborah Munoz is a 30 y.o. female  Chief Complaint  Patient presents with  . Hypertension    routine follow up   . Morbid Obesity    routine follow up   She has morbid obesity and is trying to lose weight; she met with the nutritionist, got some good tips and advice; she has been working out 5-6 times a week; working out with people from work; she started training with weights last week; can tell a difference in her clothes, fitting better; her son is getting healthier too; drinking enough water most days; she is interested in pursuing medication for weight management; she has no personal hx of thyroid cancer, no known fam hx of MEN-2  She had another appt this morning, 119/74, wrist cuff; she had some salt at lunch, slice of pizza; just finished period on Sunday;  She takes the meloxicam for her back; that has not been a problem, but knee started bothering her last week; helping the knee; better now  She has hypothyroidism; due for recheck of labs  Used to have eczema head to toe; dark patches on her sides; also has dry peeling itchy areas of skin on her hands  She asked about ADHD medication briefly  Relevant past medical, surgical, family and social history reviewed and updated as indicated. Interim medical history since our last visit reviewed. Allergies and medications reviewed and updated.  Review of Systems  Per HPI unless specifically indicated above     Objective:    BP 145/92 mmHg  Pulse 88  Temp(Src) 98.5 F (36.9 C)  Ht 5' 2.5" (1.588 m)  Wt 290 lb (131.543 kg)  BMI 52.16 kg/m2  SpO2 98%  Wt Readings from Last 3 Encounters:  10/20/15 290 lb (131.543 kg)  09/26/15 295 lb (133.811 kg)  09/07/15 290 lb (131.543 kg)     Physical Exam  Constitutional: She appears well-developed and well-nourished. No distress.  Morbidly obese; weight down five pounds in the last 4 weeks  Eyes: EOM are normal. No scleral icterus.  Neck: No thyromegaly present.  Cardiovascular: Normal rate and regular rhythm.   Pulmonary/Chest: Effort normal and breath sounds normal.  Abdominal: She exhibits no distension.  Morbidly obese  Musculoskeletal: She exhibits no edema.  Skin: Rash (several dark fairly well-circumscribed lesions, macular, on the trunk) noted. She is not diaphoretic. No pallor.  On the finger, there are vesicular, erythematous patches, intriginous areas; no drainage  Psychiatric: She has a normal mood and affect. Her behavior is normal. Judgment and thought content normal.  Very pleasant and cooperative      Assessment & Plan:   Problem List Items Addressed This Visit      Cardiovascular and Mediastinum   Hypertension - Primary (Chronic)    Not at goal; discussed options with patient; she would prefer to try DASH guidelines, weight loss rather than add medicine; she'll monitor and call if not to goal; significant weight loss would likely help her pressures; will avoid stimulant treatment for ADHD at this time        Endocrine   Hypothyroidism    Check TSH, adjust medicine if indicated  Musculoskeletal and Integument   Dyshidrotic hand dermatitis    Avoid exposure to hot water; use corticosteroid, too strong for face, groin, axilla        Other   Morbid obesity (HCC) (Chronic)    Encouragement given for healthy eating and activity; discussed options for medical treatment of obesity, including contrave; she will read about this; she has MR and TR so Belviq is lower on my list of preferences; she is needle-averse, so she was not interested in Saxenda; she'll read about Contrave and can call me next week for prescription if desired      Adult ADHD    Will not use stimulant right now with  pressures like this      RBC microcytosis    Check ferritin and CBC; consider hemoglobin electrophoresis is not already done (could check [facility] chart after labs are back)       Other Visit Diagnoses    Tinea versicolor        selenium-containing shampoo, lather, 2-3x a week for a month; if not improving/resolving, refer to derm       Follow up plan: No Follow-up on file.   Meds ordered this encounter  Medications  . clobetasol cream (TEMOVATE) 0.05 %    Sig: Apply 1 application topically 2 (two) times daily. Too strong for face, underarms, groin, children    Dispense:  60 g    Refill:  2

## 2015-10-20 NOTE — Patient Instructions (Addendum)
Vitamin D3 2000 iu daily for the next 3-4 months We'll check labs today Read about Contrave and call me next week if you would like a prescription  Use the new cream if needed Keep hands out of hot water Your goal blood pressure is less than 140 mmHg on top and less than 90 on the bottom Try to follow the DASH guidelines (DASH stands for Dietary Approaches to Stop Hypertension) Try to limit the sodium in your diet.  Ideally, consume less than 1.5 grams (less than 1,500mg ) per day. Do not add salt when cooking or at the table.  Check the sodium amount on labels when shopping, and choose items lower in sodium when given a choice. Avoid or limit foods that already contain a lot of sodium. Eat a diet rich in fruits and vegetables and whole grains.

## 2015-10-21 DIAGNOSIS — L301 Dyshidrosis [pompholyx]: Secondary | ICD-10-CM | POA: Insufficient documentation

## 2015-10-21 LAB — FERRITIN: Ferritin: 155 ng/mL — ABNORMAL HIGH (ref 15–150)

## 2015-10-21 LAB — CBC WITH DIFFERENTIAL/PLATELET
Basophils Absolute: 0.1 10*3/uL (ref 0.0–0.2)
Basos: 1 %
EOS (ABSOLUTE): 0.4 10*3/uL (ref 0.0–0.4)
Eos: 4 %
HEMATOCRIT: 36.5 % (ref 34.0–46.6)
HEMOGLOBIN: 12.2 g/dL (ref 11.1–15.9)
IMMATURE GRANS (ABS): 0 10*3/uL (ref 0.0–0.1)
Immature Granulocytes: 0 %
LYMPHS ABS: 2.3 10*3/uL (ref 0.7–3.1)
Lymphs: 24 %
MCH: 25.1 pg — AB (ref 26.6–33.0)
MCHC: 33.4 g/dL (ref 31.5–35.7)
MCV: 75 fL — AB (ref 79–97)
Monocytes Absolute: 0.9 10*3/uL (ref 0.1–0.9)
Monocytes: 9 %
NEUTROS ABS: 5.9 10*3/uL (ref 1.4–7.0)
Neutrophils: 62 %
Platelets: 368 10*3/uL (ref 150–379)
RBC: 4.87 x10E6/uL (ref 3.77–5.28)
RDW: 14.6 % (ref 12.3–15.4)
WBC: 9.4 10*3/uL (ref 3.4–10.8)

## 2015-10-21 LAB — LIPID PANEL W/O CHOL/HDL RATIO
CHOLESTEROL TOTAL: 137 mg/dL (ref 100–199)
HDL: 38 mg/dL — ABNORMAL LOW (ref >39)
LDL CALC: 78 mg/dL (ref 0–99)
TRIGLYCERIDES: 107 mg/dL (ref 0–149)
VLDL CHOLESTEROL CAL: 21 mg/dL (ref 5–40)

## 2015-10-21 LAB — TSH: TSH: 1.78 u[IU]/mL (ref 0.450–4.500)

## 2015-10-21 NOTE — Assessment & Plan Note (Signed)
Not at goal; discussed options with patient; she would prefer to try DASH guidelines, weight loss rather than add medicine; she'll monitor and call if not to goal; significant weight loss would likely help her pressures; will avoid stimulant treatment for ADHD at this time

## 2015-10-21 NOTE — Assessment & Plan Note (Signed)
Will not use stimulant right now with pressures like this

## 2015-10-21 NOTE — Assessment & Plan Note (Signed)
Check TSH, adjust medicine if indicated

## 2015-10-21 NOTE — Assessment & Plan Note (Signed)
Encouragement given for healthy eating and activity; discussed options for medical treatment of obesity, including contrave; she will read about this; she has MR and TR so Belviq is lower on my list of preferences; she is needle-averse, so she was not interested in [place]; she'll read about Contrave and can call me next week for prescription if desired

## 2015-10-21 NOTE — Assessment & Plan Note (Signed)
Check ferritin and CBC; consider hemoglobin electrophoresis is not already done (could check [facility] chart after labs are back)

## 2015-10-21 NOTE — Assessment & Plan Note (Signed)
Avoid exposure to hot water; use corticosteroid, too strong for face, groin, axilla

## 2015-10-22 ENCOUNTER — Encounter: Payer: Self-pay | Admitting: Family Medicine

## 2015-10-25 ENCOUNTER — Other Ambulatory Visit: Payer: Self-pay | Admitting: Family Medicine

## 2015-10-25 MED ORDER — NALTREXONE-BUPROPION HCL ER 8-90 MG PO TB12: ORAL_TABLET | ORAL | Status: DC

## 2015-10-26 ENCOUNTER — Ambulatory Visit: Payer: BC Managed Care – PPO | Admitting: Dietician

## 2015-11-06 ENCOUNTER — Encounter: Payer: Self-pay | Admitting: Family Medicine

## 2015-11-07 MED ORDER — LORCASERIN HCL 10 MG PO TABS: 1.0000 | ORAL_TABLET | Freq: Two times a day (BID) | ORAL | Status: DC

## 2015-11-22 ENCOUNTER — Telehealth: Payer: Self-pay | Admitting: Family Medicine

## 2015-11-22 MED ORDER — LORCASERIN HCL 10 MG PO TABS: 1.0000 | ORAL_TABLET | Freq: Two times a day (BID) | ORAL | Status: DC

## 2015-11-22 NOTE — Telephone Encounter (Signed)
I received note from pharmacy management company; they approved contrave; I went into med list and cancelled Belviq; then realized patient sent me a note saying she'd rather do Belviq than Contrave I called her and left message to say we'll proceed with the Belviq and to NOT use both diet medicines

## 2015-11-24 ENCOUNTER — Encounter: Payer: Self-pay | Admitting: Dietician

## 2015-11-24 ENCOUNTER — Telehealth: Payer: Self-pay | Admitting: Family Medicine

## 2015-11-24 NOTE — Telephone Encounter (Signed)
Left message to call.

## 2015-11-24 NOTE — Telephone Encounter (Signed)
Please let patient know that the nutritionist sent [place] a note We want her to be working with the nutritionist as a comprehensive part of her weight loss efforts We don't want her to just take the pills and skip out on the education and behavior modification that the nutritionist can provide Please have her reschedule and work with nutritionist

## 2015-11-24 NOTE — Progress Notes (Signed)
Have not heard back from patient since she cancelled her appointment on 10/26/15. Sent discharge letter to MD.

## 2015-11-27 NOTE — Telephone Encounter (Signed)
Left message to call.

## 2015-11-28 NOTE — Telephone Encounter (Signed)
Patient notified. She stated she hasn't started talking the [place]. She says life has been crazy busy and her child has been sick so that's why [facility]. She states once things calm down, she will call them back and reschedule an appointment.

## 2015-11-29 ENCOUNTER — Encounter: Payer: Self-pay | Admitting: Family Medicine

## 2015-12-02 ENCOUNTER — Other Ambulatory Visit: Payer: Self-pay | Admitting: Family Medicine

## 2015-12-04 NOTE — Telephone Encounter (Signed)
Jan 2017 TSH normal; Rx approved

## 2015-12-25 DIAGNOSIS — Z6841 Body Mass Index (BMI) 40.0 and over, adult: Secondary | ICD-10-CM

## 2016-02-12 ENCOUNTER — Encounter: Payer: Self-pay | Admitting: Family Medicine

## 2016-02-12 ENCOUNTER — Ambulatory Visit (INDEPENDENT_AMBULATORY_CARE_PROVIDER_SITE_OTHER): Payer: BC Managed Care – PPO | Admitting: Family Medicine

## 2016-02-12 VITALS — BP 126/78 | HR 96 | Temp 99.3°F | Resp 18 | Wt 284.0 lb

## 2016-02-12 DIAGNOSIS — H6503 Acute serous otitis media, bilateral: Secondary | ICD-10-CM | POA: Diagnosis not present

## 2016-02-12 DIAGNOSIS — R059 Cough, unspecified: Secondary | ICD-10-CM

## 2016-02-12 DIAGNOSIS — R05 Cough: Secondary | ICD-10-CM

## 2016-02-12 DIAGNOSIS — R062 Wheezing: Secondary | ICD-10-CM

## 2016-02-12 MED ORDER — PROAIR HFA 108 (90 BASE) MCG/ACT IN AERS: 2.0000 | INHALATION_SPRAY | RESPIRATORY_TRACT | Status: DC | PRN

## 2016-02-12 MED ORDER — PREDNISONE 10 MG PO TABS: ORAL_TABLET | ORAL | Status: AC

## 2016-02-12 MED ORDER — AZITHROMYCIN 250 MG PO TABS: ORAL_TABLET | ORAL | Status: AC

## 2016-02-12 NOTE — Progress Notes (Signed)
BP 126/78 mmHg  Pulse 96  Temp(Src) 99.3 F (37.4 C) (Oral)  Resp 18  Wt 284 lb (128.822 kg)  SpO2 93%   Subjective:    Patient ID: Deborah Munoz, female    DOB: [birth year], 30 y.o.   MRN: [id number]  HPI: Deborah Munoz is a 29 y.o. female  Chief Complaint  Patient presents with  . URI    onset 5 day and worsening.  Symptoms include, low grade fever, chills, cough, SOB, right ear pain, facial pressure, headache and congestion.  Has advil cold and sinus with no relief.   Patient is well-known to me from my previous practice Has been sick for 5 days; body aches and coughing all the time; mucous coming up [facility] and trouble sleeping Went to bed at 7 pm the first night; ran a fever in her sleep Thursday night; clammy on Friday Moved into the chest Every thing in the face is swollen and full of pressure; ears are popping, left ear aching, right ear shooting pain Throat was sort of scratchy for a few days No travel, no known sick contacts Wheezing Has albuterol but needs more Dull ache in the mid anterior chest; thick yellow mucous Pushed herself over the weekend; went swimming on Saturday with her boys; felt better in the sun, but afterwards was really tired  Depression screen [facility] 2/9 02/12/2016 09/26/2015  Decreased Interest 0 0  Down, Depressed, Hopeless 0 0  PHQ - 2 Score 0 0   Relevant past medical, surgical, family and social history reviewed Past Medical History  Diagnosis Date  . Hypertension   . History of gallstones   . Thyroid disease    Past Surgical History  Procedure Laterality Date  . Hand surgery    . Cholecystectomy     Family History  Problem Relation Age of Onset  . Hypertension Mother   . Diabetes Mother   . Diabetes Father   . Hypertension Father   . Heart disease Maternal Aunt   . Heart disease Maternal Aunt   . Diabetes Maternal Grandmother   . Hypertension Maternal Grandmother   . Cancer Maternal Grandfather     bone  . Diabetes  Paternal Grandmother   . Hypertension Paternal Grandmother    Social History  Substance Use Topics  . Smoking status: Never Smoker   . Smokeless tobacco: Never Used  . Alcohol Use: 0.0 oz/week    0 Standard drinks or equivalent per week     Comment: occasionally   Interim medical history since last visit reviewed. Allergies and medications reviewed  Review of Systems Per HPI unless specifically indicated above     Objective:    BP 126/78 mmHg  Pulse 96  Temp(Src) 99.3 F (37.4 C) (Oral)  Resp 18  Wt 284 lb (128.822 kg)  SpO2 93%  Wt Readings from Last 3 Encounters:  02/12/16 284 lb (128.822 kg)  10/20/15 290 lb (131.543 kg)  09/26/15 295 lb (133.811 kg)    Physical Exam  Constitutional: She appears well-developed and well-nourished. No distress (but appears to not feel well).  HENT:  Head: Normocephalic and atraumatic.  Right Ear: No drainage. Tympanic membrane is injected and erythematous. Tympanic membrane is not bulging.  Left Ear: No drainage. Tympanic membrane is injected and erythematous. Tympanic membrane is not bulging.  Nose: Rhinorrhea present.  Mouth/Throat: Oropharynx is clear and moist and mucous membranes are normal.  Eyes: EOM are normal. No scleral icterus.  Neck: No thyromegaly present.  Cardiovascular: Normal rate, regular rhythm and normal heart sounds.   No murmur heard. Pulmonary/Chest: Effort normal and breath sounds normal. No respiratory distress. She has no wheezes. She has no rales.  Abdominal: She exhibits no distension.  Lymphadenopathy:    She has cervical adenopathy ([place]oddy).  Neurological: She is alert. She exhibits normal muscle tone.  Skin: Skin is warm and dry. She is not diaphoretic. No pallor.  Psychiatric: She has a normal mood and affect. Her behavior is normal. Judgment and thought content normal.      Assessment & Plan:   Problem List Items Addressed This Visit    None    Visit Diagnoses    Cough    -  Primary     SABA, prednisone; likely started as viral process, may be showing signs of secondary bacterial infection    Wheezing        use SABA and start prednisone    Bilateral acute serous otitis media, recurrence not specified        suspect secondary bacterial infection; start zithromax; rest, hydration, call if worsening    Relevant Medications    azithromycin (ZITHROMAX) 250 MG tablet       Follow up plan: No Follow-up on file.  An after-visit summary was printed and given to the patient at [place].  Please see the patient instructions which may contain other information and recommendations beyond what is mentioned above in the assessment and plan.  Meds ordered this encounter  Medications  . DISCONTD: PROAIR HFA 108 (90 Base) MCG/ACT inhaler    Sig:   . albuterol (PROAIR HFA) 108 (90 Base) MCG/ACT inhaler    Sig: Inhale into the lungs.  Marland Kitchen PROAIR HFA 108 (90 Base) MCG/ACT inhaler    Sig: Inhale 2 puffs into the lungs every 4 (four) hours as needed for wheezing or shortness of breath.    Dispense:  1 Inhaler    Refill:  1  . azithromycin (ZITHROMAX) 250 MG tablet    Sig: Two by mouth today, then one daily for four more days    Dispense:  6 tablet    Refill:  0  . predniSONE (DELTASONE) 10 MG tablet    Sig: Take 4 pills by mouth daily x 2 days, then 3 pills daily x 2 days, 2 pills daily x 2 days, and 1 pill daily x 2 days; take with food or milk    Dispense:  20 tablet    Refill:  0

## 2016-02-12 NOTE — Patient Instructions (Signed)
Try Mucinex DM Try vitamin C (orange juice if not diabetic or vitamin C tablets) and drink green tea to help your immune system during your illness Get plenty of rest and hydration Start the antibiotics Please do eat yogurt daily or take a probiotic daily for the next month or two We want to replace the healthy germs in the gut If you notice foul, watery diarrhea in the next two months, schedule an appointment RIGHT AWAY If you get worse, go to ER or urgent care Out of work x 3 days

## 2016-02-16 ENCOUNTER — Encounter: Payer: Self-pay | Admitting: Family Medicine

## 2016-02-21 NOTE — Telephone Encounter (Signed)
I called, left msg

## 2016-03-21 ENCOUNTER — Encounter: Payer: Self-pay | Admitting: Family Medicine

## 2016-03-21 ENCOUNTER — Ambulatory Visit (INDEPENDENT_AMBULATORY_CARE_PROVIDER_SITE_OTHER): Payer: BC Managed Care – PPO | Admitting: Family Medicine

## 2016-03-21 VITALS — BP 132/90 | HR 74 | Temp 98.1°F | Resp 16 | Wt 285.0 lb

## 2016-03-21 DIAGNOSIS — M719 Bursopathy, unspecified: Secondary | ICD-10-CM

## 2016-03-21 DIAGNOSIS — M67919 Unspecified disorder of synovium and tendon, unspecified shoulder: Secondary | ICD-10-CM

## 2016-03-21 DIAGNOSIS — M7551 Bursitis of right shoulder: Secondary | ICD-10-CM | POA: Diagnosis not present

## 2016-03-21 DIAGNOSIS — IMO0002 Reserved for concepts with insufficient information to code with codable children: Secondary | ICD-10-CM | POA: Insufficient documentation

## 2016-03-21 MED ORDER — MELOXICAM 15 MG PO TABS
15.0000 mg | ORAL_TABLET | Freq: Every day | ORAL | Status: DC
Start: 2016-03-21 — End: 2016-04-17

## 2016-03-21 MED ORDER — HYDROCODONE-ACETAMINOPHEN 5-325 MG PO TABS: 1.0000 | ORAL_TABLET | ORAL | Status: DC | PRN

## 2016-03-21 NOTE — Assessment & Plan Note (Signed)
Suspect overuse with recent increase in physical demands; out of work today and tomorrow; ice topically; meloxicam daily; hydrocodone for more severe pain; cautions given, do not mix with other pain pills, nerve pills, anxiety, pills, sleeping pills, or alcohol; do not drive for 6 hours after taking pain medicine; if not improving, I can refer for PT; do not believe xrays warranted at this time

## 2016-03-21 NOTE — Assessment & Plan Note (Signed)
BMI greater than 50; patient interested in bariatric surgery; I fully support her in her decision to seek this route, to see if bariatric surgery would be an option for her; referral entered

## 2016-03-21 NOTE — Progress Notes (Signed)
BP 132/90 mmHg  Pulse 74  Temp(Src) 98.1 F (36.7 C) (Oral)  Resp 16  Wt 285 lb (129.275 kg)  SpO2 96%   Subjective:    Patient ID: Deborah Munoz, female    DOB: [birth year], 30 y.o.   MRN: [id number]  HPI: Deborah Munoz is a 30 y.o. female  Chief Complaint  Patient presents with  . Arm Pain    right onset 2 days worsening.  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; slinging crates 8 hours a day Pain like a burning in the right arm, pulling burning pain Front of right shoulder and arm Right-handed Tried ice and heat and hot water, aleve Nothing has touched it at all No weakness in the arm, but fingers feel weird Taking pieces and going up above the head; lifting sturdy plastic 2.5 feet wide, from below waist to above head Scanning things over her head, rotation, sorting to scanning to stacking Down on knees Pain 9.5 out of 10 Movement makes it worse; just sitting is a 7 out of 10 in intensity though  She has morbid obesity; her fiance is getting ready to have gastric sleeve procedure and she would like a referral to see the same bariatric surgeon  She had a double ear infection last month and wants to see if it's all okay; finished round of prednisone last month  Relevant past medical, surgical, family and social history reviewed Past Medical History  Diagnosis Date  . Hypertension   . History of gallstones   . Thyroid disease    Past Surgical History  Procedure Laterality Date  . Hand surgery    . Cholecystectomy     Social History  Substance Use Topics  . Smoking status: Never Smoker   . Smokeless tobacco: Never Used  . Alcohol Use: 0.0 oz/week    0 Standard drinks or equivalent per week     Comment: occasionally   Interim medical history since last visit reviewed. Allergies and medications reviewed  Review of Systems Per HPI unless specifically indicated above     Objective:    BP 132/90 mmHg  Pulse 74  Temp(Src) 98.1 F (36.7 C)  (Oral)  Resp 16  Wt 285 lb (129.275 kg)  SpO2 96%  Wt Readings from Last 3 Encounters:  03/21/16 285 lb (129.275 kg)  02/12/16 284 lb (128.822 kg)  10/20/15 290 lb (131.543 kg)   body mass index is 51.26 kg/(m^2).  Physical Exam  Constitutional: She appears well-developed and well-nourished. No distress.  HENT:  Head: Normocephalic and atraumatic.  Right Ear: Hearing, tympanic membrane, external ear and ear canal normal. No drainage. Tympanic membrane is not erythematous. No middle ear effusion.  Left Ear: Hearing, tympanic membrane, external ear and ear canal normal. No drainage. Tympanic membrane is not erythematous.  No middle ear effusion.  Mouth/Throat: Mucous membranes are normal.  Cardiovascular: Normal rate and regular rhythm.   Pulses:      Radial pulses are 2+ on the right side.  Pulmonary/Chest: Effort normal and breath sounds normal.  Musculoskeletal:       Right shoulder: She exhibits decreased range of motion, tenderness and pain. She exhibits no bony tenderness, no swelling, no effusion, no crepitus, no spasm, normal pulse and normal strength.  Neurological:  Grip 5/5 on the right and left; intrinsics 5/5 bilaterally  Skin: No bruising noted.  No bruising over the right shoulder, no vesicles or rash over the area  Psychiatric:  Her mood appears not anxious. She does not exhibit a depressed mood.       Assessment & Plan:   Problem List Items Addressed This Visit      Other   Morbid obesity ([place]) (Chronic)    BMI greater than 50; patient interested in bariatric surgery; I fully support her in her decision to seek this route, to see if bariatric surgery would be an option for her; referral entered      Relevant Orders   Ambulatory referral to General Surgery   Bursitis/tendonitis, shoulder - Primary    Suspect overuse with recent increase in physical demands; out of work today and tomorrow; ice topically; meloxicam daily; hydrocodone for more severe pain;  cautions given, do not mix with other pain pills, nerve pills, anxiety, pills, sleeping pills, or alcohol; do not drive for 6 hours after taking pain medicine; if not improving, I can refer for PT; do not believe xrays warranted at this time         Follow up plan: Return if symptoms worsen or fail to improve.  An after-visit summary was printed and given to the patient at [place].  Please see the patient instructions which may contain other information and recommendations beyond what is mentioned above in the assessment and plan.  Meds ordered this encounter  Medications  . meloxicam (MOBIC) 15 MG tablet    Sig: Take 1 tablet (15 mg total) by mouth daily.    Dispense:  30 tablet    Refill:  0  . HYDROcodone-acetaminophen (NORCO/VICODIN) 5-325 MG tablet    Sig: Take 1 tablet by mouth every 4 (four) hours as needed for moderate pain.    Dispense:  20 tablet    Refill:  0    Orders Placed This Encounter  Procedures  . Ambulatory referral to General Surgery

## 2016-03-21 NOTE — Patient Instructions (Addendum)
Start the meloxicam 15 mg daily Use the pain medicine for bad pain, never mix with other pain meds or nerve pills or alcohol Don't drive for 6 hours after taking pill (hydrocodone)  Your goal blood pressure is less than 140 mmHg on top and under 90 on the bottom Try to follow the DASH guidelines (DASH stands for Dietary Approaches to Stop Hypertension) Try to limit the sodium in your diet.  Ideally, consume less than 1.5 grams (less than 1,500mg ) per day. Do not add salt when cooking or at the table.  Check the sodium amount on labels when shopping, and choose items lower in sodium when given a choice. Avoid or limit foods that already contain a lot of sodium. Eat a diet rich in fruits and vegetables and whole grains.   Tendinitis Tendinitis is swelling and inflammation of the tendons. Tendons are band-like tissues that connect muscle to bone. Tendinitis commonly occurs in the:   Shoulders (rotator cuff).  Heels (Achilles tendon).  Elbows (triceps tendon). CAUSES Tendinitis is usually caused by overusing the tendon, muscles, and joints involved. When the tissue surrounding a tendon (synovium) becomes inflamed, it is called tenosynovitis. Tendinitis commonly develops in people whose jobs require repetitive motions. SYMPTOMS  Pain.  Tenderness.  Mild swelling. DIAGNOSIS Tendinitis is usually diagnosed by physical exam. Your health care provider may also order X-rays or other imaging tests. TREATMENT Your health care provider may recommend certain medicines or exercises for your treatment. HOME CARE INSTRUCTIONS   Use a sling or splint for as long as directed by your health care provider until the pain decreases.  Put ice on the injured area.  Put ice in a plastic bag.  Place a towel between your skin and the bag.  Leave the ice on for 15-20 minutes, 3-4 times a day, or as directed by your health care provider.  Avoid using the limb while the tendon is painful. Perform gentle  range of motion exercises only as directed by your health care provider. Stop exercises if pain or discomfort increase, unless directed otherwise by your health care provider.  Only take over-the-counter or prescription medicines for pain, discomfort, or fever as directed by your health care provider. SEEK MEDICAL CARE IF:   Your pain and swelling increase.  You develop new, unexplained symptoms, especially increased numbness in the hands. MAKE SURE YOU:   Understand these instructions.  Will watch your condition.  Will get help right away if you are not doing well or get worse.   This information is not intended to replace advice given to you by your health care provider. Make sure you discuss any questions you have with your health care provider.   Document Released: 09/06/2000 Document Revised: 09/30/2014 Document Reviewed: 11/26/2010 Elsevier Interactive Patient Education [employer].

## 2016-04-12 ENCOUNTER — Encounter: Payer: Self-pay | Admitting: Family Medicine

## 2016-04-12 MED ORDER — AMOXICILLIN-POT CLAVULANATE 875-125 MG PO TABS: 1.0000 | ORAL_TABLET | Freq: Two times a day (BID) | ORAL | Status: AC

## 2016-04-17 ENCOUNTER — Other Ambulatory Visit: Payer: Self-pay | Admitting: Family Medicine

## 2016-05-14 ENCOUNTER — Encounter: Payer: Self-pay | Admitting: Family Medicine

## 2016-05-14 MED ORDER — ESCITALOPRAM OXALATE 10 MG PO TABS: 10.0000 mg | ORAL_TABLET | Freq: Every day | ORAL | 0 refills | Status: DC

## 2016-05-14 MED ORDER — HYDROXYZINE PAMOATE 25 MG PO CAPS: 25.0000 mg | ORAL_CAPSULE | Freq: Three times a day (TID) | ORAL | 0 refills | Status: DC | PRN

## 2016-05-17 ENCOUNTER — Ambulatory Visit (INDEPENDENT_AMBULATORY_CARE_PROVIDER_SITE_OTHER): Payer: BC Managed Care – PPO | Admitting: Family Medicine

## 2016-05-17 ENCOUNTER — Encounter: Payer: Self-pay | Admitting: Family Medicine

## 2016-05-17 DIAGNOSIS — F4322 Adjustment disorder with anxiety: Secondary | ICD-10-CM

## 2016-05-17 MED ORDER — HYDROXYZINE PAMOATE 25 MG PO CAPS: 25.0000 mg | ORAL_CAPSULE | Freq: Three times a day (TID) | ORAL | 0 refills | Status: DC | PRN

## 2016-05-17 MED ORDER — ESCITALOPRAM OXALATE 10 MG PO TABS: 10.0000 mg | ORAL_TABLET | Freq: Every day | ORAL | 3 refills | Status: DC

## 2016-05-17 NOTE — Patient Instructions (Signed)
Call me in 3-4 weeks with an update  12 Ways to Curb Anxiety  ?Anxiety is normal human sensation. It is what helped our ancestors survive the pitfalls of the wilderness. Anxiety is defined as experiencing worry or nervousness about an imminent event or something with an uncertain outcome. It is a feeling experienced by most people at some point in their lives. Anxiety can be triggered by a very personal issue, such as the illness of a loved one, or an event of global proportions, such as a refugee crisis. Some of the symptoms of anxiety are:  Feeling restless.  Having a feeling of impending danger.  Increased heart rate.  Rapid breathing. Sweating.  Shaking.  Weakness or feeling tired.  Difficulty concentrating on anything except the current worry.  Insomnia.  Stomach or bowel problems. What can we do about anxiety we may be feeling? There are many techniques to help manage stress and relax. Here are 12 ways you can reduce your anxiety almost immediately: 1. Turn off the constant feed of information. Take a social media sabbatical. Studies have shown that social media directly contributes to social anxiety.  2. Monitor your television viewing habits. Are you watching shows that are also contributing to your anxiety, such as 24-hour news stations? Try watching something else, or better yet, nothing at all. Instead, listen to music, read an inspirational book or practice a hobby. 3. Eat nutritious meals. Also, don't skip meals and keep healthful snacks on hand. Hunger and poor diet contributes to feeling anxious. 4. Sleep. Sleeping on a regular schedule for at least seven to eight hours a night will do wonders for your outlook when you are awake. 5. Exercise. Regular exercise will help rid your body of that anxious energy and help you get more restful sleep. 6. Try deep (diaphragmatic) breathing. Inhale slowly through your nose for five seconds and exhale through your mouth. 7. Practice  acceptance and gratitude. When anxiety hits, accept that there are things out of your control that shouldn't be of immediate concern.  8. Seek out humor. When anxiety strikes, watch a funny video, read jokes or call a friend who makes you laugh. Laughter is healing for our bodies and releases endorphins that are calming. 9. Stay positive. Take the effort to replace negative thoughts with positive ones. Try to see a stressful situation in a positive light. Try to come up with solutions rather than dwelling on the problem. 10. Figure out what triggers your anxiety. Keep a journal and make note of anxious moments and the events surrounding them. This will help you identify triggers you can avoid or even eliminate. 11. Talk to someone. Let a trusted friend, family member or even trained professional know that you are feeling overwhelmed and anxious. Verbalize what you are feeling and why.  12. Volunteer. If your anxiety is triggered by a crisis on a large scale, become an advocate and work to resolve the problem that is causing you unease. Anxiety is often unwelcome and can become overwhelming. If not kept in check, it can become a disorder that could require medical treatment. However, if you take the time to care for yourself and avoid the triggers that make you anxious, you will be able to find moments of relaxation and clarity that make your life much more enjoyable.   Steps to Elicit the Relaxation Response The following is the technique reprinted with permission from Dr. Billie Ruddy book The Relaxation Response pages 162-163 1. Sit quietly in a  comfortable position. 2. Close your eyes. 3. Deeply relax all your muscles,  beginning at your feet and progressing up to your face.  Keep them relaxed. 4. Breathe through your nose.  Become aware of your breathing.  As you breathe out, say the word, "one"*,  silently to yourself. For example,  breathe in ... out, "one",- in .. out, "one", etc.   Breathe easily and naturally. 5. Continue for 10 to 20 minutes.  You may open your eyes to check the time, but do not use an alarm.  When you finish, sit quietly for several minutes,  at first with your eyes closed and later with your eyes opened.  Do not stand up for a few minutes. 6. Do not worry about whether you are successful  in achieving a deep level of relaxation.  Maintain a passive attitude and permit relaxation to occur at its own pace.  When distracting thoughts occur,  try to ignore them by not dwelling upon them  and return to repeating "one."  With practice, the response should come with little effort.  Practice the technique once or twice daily,  but not within two hours after any meal,  since the digestive processes seem to interfere with  the elicitation of the Relaxation Response. * It is better to use a soothing, mellifluous sound, preferably with no meaning. or association, to avoid stimulation of unnecessary thoughts - a mantra.

## 2016-05-17 NOTE — Progress Notes (Signed)
BP 120/82   Pulse 81   Temp 98.9 F (37.2 C) (Oral)   Resp 16   Wt 283 lb 11.2 oz (128.7 kg)   LMP 05/15/2016 (Exact Date)   SpO2 96%   BMI 51.06 kg/m    Subjective:    Patient ID: Deborah Munoz, female    DOB: 1986-08-09, 30 y.o.   MRN: [id number]  HPI: Deborah Munoz is a 30 y.o. female  Chief Complaint  Patient presents with  . Follow-up    anxiety   Patient is here for f/u of recent medicine addition, stress; see MyChart note She discussed the stress she has been through lately Started medicine earlier this week No trouble sleeping Using prayer to help get through stressful times Has used counselor in the past Used to get stress migraines, but none since father died in 12/23/08 Was internalizing it a lot She talked to her mother about this Taking the hydroxyzine at night, does make her sleepy Got through yesterday without getting stressed out No thoughts of self harm or hurting others Rx made her nauseated the first day but kept at it, no nausea the 2nd day or after  Depression screen [facility] 2/9 05/17/2016 02/12/2016 09/26/2015  Decreased Interest 0 0 0  Down, Depressed, Hopeless 0 0 0  PHQ - 2 Score 0 0 0   Relevant past medical, surgical, family and social history reviewed Past Medical History:  Diagnosis Date  . History of gallstones   . Hypertension   . Thyroid disease    Family History  Problem Relation Age of Onset  . Hypertension Mother   . Diabetes Mother   . Diabetes Father   . Hypertension Father   . Heart disease Maternal Aunt   . Heart disease Maternal Aunt   . Diabetes Maternal Grandmother   . Hypertension Maternal Grandmother   . Cancer Maternal Grandfather     bone  . Diabetes Paternal Grandmother   . Hypertension Paternal Grandmother    Social History  Substance Use Topics  . Smoking status: Never Smoker  . Smokeless tobacco: Never Used  . Alcohol use 0.0 oz/week     Comment: occasionally  Married; husband recently had bariatric  surgery  Interim medical history since last visit reviewed. Allergies and medications reviewed  Review of Systems Per HPI unless specifically indicated above     Objective:    BP 120/82   Pulse 81   Temp 98.9 F (37.2 C) (Oral)   Resp 16   Wt 283 lb 11.2 oz (128.7 kg)   LMP 05/15/2016 (Exact Date)   SpO2 96%   BMI 51.06 kg/m   Wt Readings from Last 3 Encounters:  05/17/16 283 lb 11.2 oz (128.7 kg)  03/21/16 285 lb (129.3 kg)  02/12/16 284 lb (128.8 kg)    Physical Exam  Constitutional: She appears well-developed and well-nourished. No distress.  Morbidly obese  Eyes: EOM are normal.  Neck: No thyromegaly present.  Cardiovascular: Normal rate.   Pulmonary/Chest: Effort normal.  Abdominal:  Morbidly obese  Skin: No rash noted. She is not diaphoretic. No pallor.  Psychiatric: She has a normal mood and affect. Her behavior is normal. Judgment and thought content normal.  Very pleasant and cooperative      Assessment & Plan:   Problem List Items Addressed This Visit      Other   Morbid obesity ([place]) (Chronic)    Glad that she is considering bariatric surgery; I fully support her in  this effort      Adjustment disorder with anxiety    Glad to hear that she is having some improvement already; meditation, counseling recommended; continue SSRI, patient may call in a few weeks to give update; warned about risk of hypomania or mania, things to watch for; will have her also continue hydroxyzine PRN; I will not start benzo for her symptoms; see AVS       Other Visit Diagnoses   None.      Follow up plan: No Follow-up on file.  An after-visit summary was printed and given to the patient at [place].  Please see the patient instructions which may contain other information and recommendations beyond what is mentioned above in the assessment and plan.  Meds ordered this encounter  Medications  . escitalopram (LEXAPRO) 10 MG tablet    Sig: Take 1 tablet (10 mg total)  by mouth daily.    Dispense:  30 tablet    Refill:  3  . hydrOXYzine (VISTARIL) 25 MG capsule    Sig: Take 1 capsule (25 mg total) by mouth 3 (three) times daily as needed for anxiety.    Dispense:  30 capsule    Refill:  0    No orders of the defined types were placed in this encounter.

## 2016-05-19 DIAGNOSIS — F4322 Adjustment disorder with anxiety: Secondary | ICD-10-CM | POA: Insufficient documentation

## 2016-05-19 NOTE — Assessment & Plan Note (Signed)
Glad that she is considering bariatric surgery; I fully support her in this effort

## 2016-05-19 NOTE — Assessment & Plan Note (Signed)
Glad to hear that she is having some improvement already; meditation, counseling recommended; continue SSRI, patient may call in a few weeks to give update; warned about risk of hypomania or mania, things to watch for; will have her also continue hydroxyzine PRN; I will not start benzo for her symptoms; see AVS

## 2016-09-26 ENCOUNTER — Other Ambulatory Visit: Payer: Self-pay

## 2016-09-26 MED ORDER — HYDROCHLOROTHIAZIDE 25 MG PO TABS: 25.0000 mg | ORAL_TABLET | Freq: Every day | ORAL | 0 refills | Status: DC

## 2016-09-26 NOTE — Telephone Encounter (Signed)
Please ask patient to schedule an appointment in the next month for f/u Last labs were Jan 2017 I refilled med as requested Thank you

## 2016-09-27 NOTE — Telephone Encounter (Signed)
MAILBOX IS FULL

## 2016-10-01 ENCOUNTER — Encounter: Payer: Self-pay | Admitting: Family Medicine

## 2016-10-18 ENCOUNTER — Encounter: Payer: Self-pay | Admitting: Family Medicine

## 2016-10-18 ENCOUNTER — Ambulatory Visit (INDEPENDENT_AMBULATORY_CARE_PROVIDER_SITE_OTHER): Payer: BC Managed Care – PPO | Admitting: Family Medicine

## 2016-10-18 DIAGNOSIS — I10 Essential (primary) hypertension: Secondary | ICD-10-CM

## 2016-10-18 DIAGNOSIS — R300 Dysuria: Secondary | ICD-10-CM | POA: Diagnosis not present

## 2016-10-18 DIAGNOSIS — F418 Other specified anxiety disorders: Secondary | ICD-10-CM | POA: Diagnosis not present

## 2016-10-18 DIAGNOSIS — L83 Acanthosis nigricans: Secondary | ICD-10-CM | POA: Insufficient documentation

## 2016-10-18 DIAGNOSIS — Z975 Presence of (intrauterine) contraceptive device: Secondary | ICD-10-CM | POA: Diagnosis not present

## 2016-10-18 DIAGNOSIS — E559 Vitamin D deficiency, unspecified: Secondary | ICD-10-CM

## 2016-10-18 DIAGNOSIS — Z0000 Encounter for general adult medical examination without abnormal findings: Secondary | ICD-10-CM | POA: Diagnosis not present

## 2016-10-18 DIAGNOSIS — R7989 Other specified abnormal findings of blood chemistry: Secondary | ICD-10-CM | POA: Insufficient documentation

## 2016-10-18 DIAGNOSIS — E039 Hypothyroidism, unspecified: Secondary | ICD-10-CM | POA: Diagnosis not present

## 2016-10-18 LAB — CBC WITH DIFFERENTIAL/PLATELET
BASOS ABS: 0 {cells}/uL (ref 0–200)
Basophils Relative: 0 %
EOS PCT: 5 %
Eosinophils Absolute: 450 cells/uL (ref 15–500)
HCT: 39.1 % (ref 35.0–45.0)
Hemoglobin: 12.6 g/dL (ref 11.7–15.5)
LYMPHS PCT: 22 %
Lymphs Abs: 1980 cells/uL (ref 850–3900)
MCH: 24.8 pg — AB (ref 27.0–33.0)
MCHC: 32.2 g/dL (ref 32.0–36.0)
MCV: 76.8 fL — AB (ref 80.0–100.0)
MONOS PCT: 8 %
MPV: 8.7 fL (ref 7.5–12.5)
Monocytes Absolute: 720 cells/uL (ref 200–950)
Neutro Abs: 5850 cells/uL (ref 1500–7800)
Neutrophils Relative %: 65 %
PLATELETS: 364 10*3/uL (ref 140–400)
RBC: 5.09 MIL/uL (ref 3.80–5.10)
RDW: 14.6 % (ref 11.0–15.0)
WBC: 9 10*3/uL (ref 3.8–10.8)

## 2016-10-18 LAB — TSH: TSH: 4.24 mIU/L

## 2016-10-18 LAB — FERRITIN: Ferritin: 155 ng/mL — ABNORMAL HIGH (ref 10–154)

## 2016-10-18 MED ORDER — ALPRAZOLAM 0.5 MG PO TABS: ORAL_TABLET | ORAL | 0 refills | Status: DC

## 2016-10-18 NOTE — Assessment & Plan Note (Signed)
Check labs 

## 2016-10-18 NOTE — Patient Instructions (Addendum)
Start miralax 1/2 to 1 scoop daily Let's get labs today We'll refer you to the GYN If you have not heard anything from my staff in a week about any orders/referrals/studies from today, please contact us here to follow-up [phone] Check out the information at [email or web address].[email or web address] entitled "Nutrition for Weight Loss: What You Need to Know about Fad Diets" Try to lose between 1-2 pounds per week by taking in fewer calories and burning off more calories You can succeed by limiting portions, limiting foods dense in calories and fat, becoming more active, and drinking 8 glasses of water a day (64 ounces) Don't skip meals, especially breakfast, as skipping meals may alter your metabolism Do not use over-the-counter weight loss pills or gimmicks that claim rapid weight loss A healthy BMI (or body mass index) is between 18.5 and 24.9 You can calculate your ideal BMI at the [place] website [employer]fr

## 2016-10-18 NOTE — Assessment & Plan Note (Signed)
Check liver, kidneys 

## 2016-10-18 NOTE — Assessment & Plan Note (Signed)
Rx given for alprazolam; explained expected effect; take one, may repeat second in 30 minutes if needed; do not drive for four to six hours after taking pill

## 2016-10-18 NOTE — Assessment & Plan Note (Signed)
Check level; may be marker of inflammation (obesity)

## 2016-10-18 NOTE — Assessment & Plan Note (Signed)
Check glucose and A1c; weight loss is key for prevention of diabetes down the road

## 2016-10-18 NOTE — Assessment & Plan Note (Signed)
Check thyroid today; she will try apple cider vinegar

## 2016-10-18 NOTE — Assessment & Plan Note (Signed)
controlled 

## 2016-10-18 NOTE — Assessment & Plan Note (Signed)
Due for removal March 2018; refer to GYN

## 2016-10-18 NOTE — Progress Notes (Signed)
BP 118/68   Pulse 90   Temp 98.5 F (36.9 C) (Oral)   Resp 14   Wt 288 lb 3.2 oz (130.7 kg)   LMP 10/02/2016   SpO2 98%   BMI 51.87 kg/m    Subjective:    Patient ID: Deborah Munoz, female    DOB: [birth year], 31 y.o.   MRN: [id number]  HPI: Deborah Munoz is a 31 y.o. female  Chief Complaint  Patient presents with  . Follow-up   Patient is here for f/u  Since last visit, she saw Dr. Clayborn Bigness (cardiologist) for shortness of breath She was having symptoms in her upper chest for 3 days; no acid reflex; she had a weird sensation on the left arm on day 2 of the symptoms; five minutes of symptoms; kind of painful so she wanted to get it checked out HCM runs in her family; two aunts died from it; she has been tested for it and  Dr. Clayborn Bigness did a holter and she goes back to have echo and stress test soon No medicines added; he heard a slight murmur and it sounded fine  Hypothyroidism Last TSH was January 2017 Has BMs 3x a day, but thinks she had a large BM and when she looks, it's not a large amount; used mag citrate the weekend before last; cleaned out; was bloated before that; feels like something there when bloated; she googled and checked fibroid; she has a GYN and will see [facility] about this; time to  Drinks tons of water; quit drinking sodas in December  For the last week, burning at the end of urination; no urine odor, but it was super dark yesterday; not taking B12 any more; usually clear; little bit of right flank pain; not sure if mattress; no blood in the urine; no fever  She is going to have a tooth pulled; she is really anxious about this; she talked to her dentist and she suggested one or two Xanax, to see if her primary could give her those prior to the procedure; she can drink a glass of alcohol and does not get overly sedated  Depression screen [facility] 2/9 10/18/2016 05/17/2016 02/12/2016 09/26/2015  Decreased Interest 0 0 0 0  Down, Depressed, Hopeless 0 0 0 0  PHQ  - 2 Score 0 0 0 0   Relevant past medical, surgical, family and social history reviewed Past Medical History:  Diagnosis Date  . History of gallstones   . Hypertension   . Morbid obesity ([place]) 05/08/2012  . Thyroid disease    Past Surgical History:  Procedure Laterality Date  . CHOLECYSTECTOMY    . HAND SURGERY     Family History  Problem Relation Age of Onset  . Hypertension Mother   . Diabetes Mother   . Diabetes Father   . Hypertension Father   . Heart disease Maternal Aunt   . Heart disease Maternal Aunt   . Diabetes Maternal Grandmother   . Hypertension Maternal Grandmother   . Cancer Maternal Grandfather     bone  . Diabetes Paternal Grandmother   . Hypertension Paternal Grandmother    Social History  Substance Use Topics  . Smoking status: Never Smoker  . Smokeless tobacco: Never Used  . Alcohol use 0.0 oz/week     Comment: occasionally    Interim medical history since last visit reviewed. Allergies and medications reviewed  Review of Systems Per HPI unless specifically indicated above     Objective:  BP 118/68   Pulse 90   Temp 98.5 F (36.9 C) (Oral)   Resp 14   Wt 288 lb 3.2 oz (130.7 kg)   LMP 10/02/2016   SpO2 98%   BMI 51.87 kg/m   Wt Readings from Last 3 Encounters:  10/18/16 288 lb 3.2 oz (130.7 kg)  05/17/16 283 lb 11.2 oz (128.7 kg)  03/21/16 285 lb (129.3 kg)    Physical Exam  Constitutional: She appears well-developed and well-nourished. No distress.  Morbidly obese  Eyes: EOM are normal.  Neck: No thyromegaly present.  Cardiovascular: Normal rate and regular rhythm.   No murmur ((I could not hear a murmur today over RUSB or LLSB)) heard. Pulmonary/Chest: Effort normal and breath sounds normal.  Abdominal: She exhibits no distension.  Morbidly obese  Musculoskeletal: She exhibits no edema.  Skin: No rash noted. She is not diaphoretic. No pallor.  Psychiatric: She has a normal mood and affect. Her behavior is normal.  Judgment and thought content normal.  Very pleasant and cooperative    Results for orders placed or performed in visit on 10/20/15  Lipid Panel w/o Chol/HDL Ratio  Result Value Ref Range   Cholesterol, Total 137 100 - 199 mg/dL   Triglycerides 107 0 - 149 mg/dL   HDL 38 (L) >39 mg/dL   VLDL Cholesterol Cal 21 5 - 40 mg/dL   LDL Calculated 78 0 - 99 mg/dL  CBC with Differential/Platelet  Result Value Ref Range   WBC 9.4 3.4 - 10.8 x10E3/uL   RBC 4.87 3.77 - 5.28 x10E6/uL   Hemoglobin 12.2 11.1 - 15.9 g/dL   Hematocrit 36.5 34.0 - 46.6 %   MCV 75 (L) 79 - 97 fL   MCH 25.1 (L) 26.6 - 33.0 pg   MCHC 33.4 31.5 - 35.7 g/dL   RDW 14.6 12.3 - 15.4 %   Platelets 368 150 - 379 x10E3/uL   Neutrophils 62 %   Lymphs 24 %   Monocytes 9 %   Eos 4 %   Basos 1 %   Neutrophils Absolute 5.9 1.4 - 7.0 x10E3/uL   Lymphocytes Absolute 2.3 0.7 - 3.1 x10E3/uL   Monocytes Absolute 0.9 0.1 - 0.9 x10E3/uL   EOS (ABSOLUTE) 0.4 0.0 - 0.4 x10E3/uL   Basophils Absolute 0.1 0.0 - 0.2 x10E3/uL   Immature Granulocytes 0 %   Immature Grans (Abs) 0.0 0.0 - 0.1 x10E3/uL  Ferritin  Result Value Ref Range   Ferritin 155 (H) 15 - 150 ng/mL  TSH  Result Value Ref Range   TSH 1.780 0.450 - 4.500 uIU/mL      Assessment & Plan:   Problem List Items Addressed This Visit      Cardiovascular and Mediastinum   Hypertension (Chronic)    controlled        Endocrine   Hypothyroidism    Check labs      Relevant Orders   TSH     Musculoskeletal and Integument   Acanthosis nigricans    Check glucose and A1c; weight loss is key for prevention of diabetes down the road      Relevant Orders   Hemoglobin A1c     Other   Vitamin D deficiency    Check labs      Relevant Orders   VITAMIN D 25 Hydroxy (Vit-D Deficiency, Fractures)   Preventative health care    Check liver, kidneys      Relevant Orders   CBC with Differential/Platelet   COMPLETE METABOLIC  PANEL WITH GFR   Lipid panel   Morbid  obesity (H[facility]) (Chronic)    Check thyroid today; she will try apple cider vinegar      IUD (intrauterine device) in place    Due for removal March 2018; refer to GYN      Relevant Orders   Ambulatory referral to Gynecology   High serum ferritin    Check level; may be marker of inflammation (obesity)      Relevant Orders   Ferritin   Dysuria    Check urine      Relevant Orders   Urinalysis w microscopic + reflex cultur   Anxiety about health    Rx given for alprazolam; explained expected effect; take one, may repeat second in 30 minutes if needed; do not drive for four to six hours after taking pill      Relevant Medications   ALPRAZolam (XANAX) 0.5 MG tablet       Follow up plan: Return in about 6 months (around 04/17/2017) for follow-up.  An after-visit summary was printed and given to the patient at [place].  Please see the patient instructions which may contain other information and recommendations beyond what is mentioned above in the assessment and plan.  Meds ordered this encounter  Medications  . Apple Cider Vinegar 600 MG CAPS    Sig: Take by mouth.  . ALPRAZolam (XANAX) 0.5 MG tablet    Sig: One or two tablets by mouth one hour prior to procedure; do not drive for four to six hours    Dispense:  2 tablet    Refill:  0    Orders Placed This Encounter  Procedures  . CBC with Differential/Platelet  . COMPLETE METABOLIC PANEL WITH GFR  . TSH  . Urinalysis w microscopic + reflex cultur  . Hemoglobin A1c  . Ferritin  . VITAMIN D 25 Hydroxy (Vit-D Deficiency, Fractures)  . Lipid panel  . Ambulatory referral to Gynecology

## 2016-10-18 NOTE — Assessment & Plan Note (Signed)
Check urine.

## 2016-10-19 LAB — URINALYSIS W MICROSCOPIC + REFLEX CULTURE
Bilirubin Urine: NEGATIVE
CASTS: NONE SEEN [LPF]
Crystals: NONE SEEN [HPF]
Glucose, UA: NEGATIVE
Hgb urine dipstick: NEGATIVE
KETONES UR: NEGATIVE
NITRITE: POSITIVE — AB
PH: 7 (ref 5.0–8.0)
SPECIFIC GRAVITY, URINE: 1.021 (ref 1.001–1.035)
Yeast: NONE SEEN [HPF]

## 2016-10-19 LAB — COMPLETE METABOLIC PANEL WITH GFR
ALT: 15 U/L (ref 6–29)
AST: 14 U/L (ref 10–30)
Albumin: 3.9 g/dL (ref 3.6–5.1)
Alkaline Phosphatase: 55 U/L (ref 33–115)
BILIRUBIN TOTAL: 0.3 mg/dL (ref 0.2–1.2)
BUN: 15 mg/dL (ref 7–25)
CHLORIDE: 104 mmol/L (ref 98–110)
CO2: 21 mmol/L (ref 20–31)
Calcium: 8.9 mg/dL (ref 8.6–10.2)
Creat: 0.82 mg/dL (ref 0.50–1.10)
Glucose, Bld: 84 mg/dL (ref 65–99)
POTASSIUM: 4.3 mmol/L (ref 3.5–5.3)
Sodium: 138 mmol/L (ref 135–146)
Total Protein: 7 g/dL (ref 6.1–8.1)

## 2016-10-19 LAB — LIPID PANEL
CHOLESTEROL: 152 mg/dL (ref <200)
HDL: 46 mg/dL — ABNORMAL LOW (ref >50)
LDL Cholesterol: 93 mg/dL (ref <100)
TRIGLYCERIDES: 65 mg/dL (ref <150)
Total CHOL/HDL Ratio: 3.3 Ratio (ref <5.0)
VLDL: 13 mg/dL (ref <30)

## 2016-10-19 LAB — HEMOGLOBIN A1C
Hgb A1c MFr Bld: 5.3 % (ref <5.7)
MEAN PLASMA GLUCOSE: 105 mg/dL

## 2016-10-19 LAB — VITAMIN D 25 HYDROXY (VIT D DEFICIENCY, FRACTURES): Vit D, 25-Hydroxy: 24 ng/mL — ABNORMAL LOW (ref 30–100)

## 2016-10-20 ENCOUNTER — Other Ambulatory Visit: Payer: Self-pay | Admitting: Family Medicine

## 2016-10-20 DIAGNOSIS — N3000 Acute cystitis without hematuria: Secondary | ICD-10-CM

## 2016-10-20 MED ORDER — NITROFURANTOIN MONOHYD MACRO 100 MG PO CAPS: 100.0000 mg | ORAL_CAPSULE | Freq: Two times a day (BID) | ORAL | 0 refills | Status: DC

## 2016-10-20 NOTE — Progress Notes (Signed)
Message left for pt; start antibiotics today, sent to [facility] in [place]

## 2016-10-20 NOTE — Progress Notes (Signed)
Orders for urine; two weeks after finishing antibiotics

## 2016-10-21 ENCOUNTER — Other Ambulatory Visit: Payer: Self-pay | Admitting: Family Medicine

## 2016-10-21 LAB — URINE CULTURE

## 2016-10-21 MED ORDER — AMOXICILLIN-POT CLAVULANATE 875-125 MG PO TABS: 1.0000 | ORAL_TABLET | Freq: Two times a day (BID) | ORAL | 0 refills | Status: AC

## 2016-10-21 NOTE — Progress Notes (Signed)
Stop macrobid; start new ABX

## 2016-10-23 ENCOUNTER — Other Ambulatory Visit: Payer: Self-pay | Admitting: Family Medicine

## 2016-10-23 NOTE — Telephone Encounter (Signed)
Cr and -lytes reviewed Jan 2018; Rx approved

## 2016-10-30 ENCOUNTER — Encounter: Payer: Self-pay | Admitting: Family Medicine

## 2016-10-30 MED ORDER — POLYMYXIN B-TRIMETHOPRIM 10000-0.1 UNIT/ML-% OP SOLN: OPHTHALMIC | 0 refills | Status: AC

## 2016-10-31 ENCOUNTER — Ambulatory Visit (INDEPENDENT_AMBULATORY_CARE_PROVIDER_SITE_OTHER): Payer: BC Managed Care – PPO | Admitting: Family Medicine

## 2016-10-31 ENCOUNTER — Encounter: Payer: Self-pay | Admitting: Family Medicine

## 2016-10-31 VITALS — BP 119/70 | HR 94 | Temp 97.5°F | Wt 288.6 lb

## 2016-10-31 DIAGNOSIS — N3000 Acute cystitis without hematuria: Secondary | ICD-10-CM

## 2016-10-31 DIAGNOSIS — H1032 Unspecified acute conjunctivitis, left eye: Secondary | ICD-10-CM | POA: Diagnosis not present

## 2016-10-31 NOTE — Patient Instructions (Addendum)
This is super super super contagious Wash hands and don't share anything like cups or silverware with anyone Continue eye drops in the affected eye(s) Out of work today and tomorrow Bacterial Conjunctivitis Introduction Bacterial conjunctivitis is an infection of your conjunctiva. This is the clear membrane that covers the white part of your eye and the inner surface of your eyelid. This condition can make your eye:  Red or pink.  Itchy. This condition is caused by bacteria. This condition spreads very easily from person to person (is contagious) and from one eye to the other eye. Follow these instructions at home: Medicines  Take or apply your antibiotic medicine as told by your doctor. Do not stop taking or applying the antibiotic even if you start to feel better.  Take or apply over-the-counter and prescription medicines only as told by your doctor.  Do not touch your eyelid with the eye drop bottle or the ointment tube. Managing discomfort  Wipe any fluid from your eye with a warm, wet washcloth or a cotton ball.  Place a cool, clean washcloth on your eye. Do this for 10-20 minutes, 3-4 times per day. General instructions  Do not wear contact lenses until the irritation is gone. Wear glasses until your doctor says it is okay to wear contacts.  Do not wear eye makeup until your symptoms are gone. Throw away any old makeup.  Change or wash your pillowcase every day.  Do not share towels or washcloths with anyone.  Wash your hands often with soap and water. Use paper towels to dry your hands.  Do not touch or rub your eyes.  Do not drive or use heavy machinery if your vision is blurry. Contact a doctor if:  You have a fever.  Your symptoms do not get better after 10 days. Get help right away if:  You have a fever and your symptoms suddenly get worse.  You have very bad pain when you move your eye.  Your face:  Hurts.  Is red.  Is swollen.  You have sudden  loss of vision. This information is not intended to replace advice given to you by your health care provider. Make sure you discuss any questions you have with your health care provider. Document Released: 06/18/2008 Document Revised: 02/15/2016 Document Reviewed: 06/22/2015  2017 Elsevier

## 2016-10-31 NOTE — Progress Notes (Signed)
BP 119/70   Pulse 94   Temp 97.5 F (36.4 C) (Oral)   Wt 288 lb 9.6 oz (130.9 kg)   LMP 10/27/2016   SpO2 93%   BMI 51.94 kg/m    Subjective:    Patient ID: Deborah Munoz, female    DOB: 1986-02-16, 31 y.o.   MRN: [id number]  HPI: Deborah Munoz is a 31 y.o. female  Chief Complaint  Patient presents with  . 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 History of gallstones   . Hypertension   . Morbid obesity ([place]) 05/08/2012  . Thyroid disease    Past Surgical History:  Procedure Laterality Date  . CHOLECYSTECTOMY    . HAND SURGERY     Social History  Substance Use Topics  . Smoking status: Never Smoker  . Smokeless tobacco: Never Used  . Alcohol use 0.0 oz/week     Comment: occasionally   Interim medical history since last visit reviewed. Allergies and medications reviewed  Review of Systems Per HPI unless specifically indicated above     Objective:    BP 119/70   Pulse 94   Temp 97.5 F (36.4 C) (Oral)   Wt 288 lb 9.6 oz (130.9 kg)   LMP 10/27/2016   SpO2 93%   BMI 51.94 kg/m    Wt Readings from Last 3 Encounters:  10/31/16 288 lb 9.6 oz (130.9 kg)  10/18/16 288 lb 3.2 oz (130.7 kg)  05/17/16 283 lb 11.2 oz (128.7 kg)    Physical Exam  Constitutional: She appears well-developed and well-nourished. No distress.  HENT:  Head: Normocephalic and atraumatic.  Eyes: EOM are normal. Pupils are equal, round, and reactive to light. Left eye exhibits discharge. Left conjunctiva is injected. Left conjunctiva has no hemorrhage. No scleral icterus. Left eye exhibits normal extraocular motion.  Pulmonary/Chest: Effort normal.  Skin: No rash noted. She is not diaphoretic.  Psychiatric: Her mood appears not anxious. She does not exhibit a depressed mood.    Results for orders placed or performed in visit on 10/18/16  Urine culture  Result Value Ref Range   Colony Count Greater than 100,000 CFU/mL    Organism ID, Bacteria KLEBSIELLA PNEUMONIAE       Susceptibility   Klebsiella pneumoniae -  (no method available)    AMPICILLIN >=32 Resistant     AMOX/CLAVULANIC <=2 Sensitive     AMPICILLIN/SULBACTAM 4 Sensitive     PIP/TAZO <=4 Sensitive     IMIPENEM <=0.25 Sensitive  CEFAZOLIN =4 Not Reportable     CEFTRIAXONE <=1 Sensitive     CEFTAZIDIME <=1 Sensitive     CEFEPIME <=1 Sensitive     GENTAMICIN <=1 Sensitive     TOBRAMYCIN <=1 Sensitive     CIPROFLOXACIN <=0.25 Sensitive     LEVOFLOXACIN <=0.12 Sensitive     NITROFURANTOIN 128 Resistant     TRIMETH/SULFA <=20 Sensitive   CBC with Differential/Platelet  Result Value Ref Range   WBC 9.0 3.8 - 10.8 K/uL   RBC 5.09 3.80 - 5.10 MIL/uL   Hemoglobin 12.6 11.7 - 15.5 g/dL   HCT 39.1 35.0 - 45.0 %   MCV 76.8 (L) 80.0 - 100.0 fL   MCH 24.8 (L) 27.0 - 33.0 pg   MCHC 32.2 32.0 - 36.0 g/dL   RDW 14.6 11.0 - 15.0 %   Platelets 364 140 - 400 K/uL   MPV 8.7 7.5 - 12.5 fL   Neutro Abs 5,850 1,500 - 7,800 cells/uL   Lymphs Abs 1,980 850 - 3,900 cells/uL   Monocytes Absolute 720 200 - 950 cells/uL   Eosinophils Absolute  450 15 - 500 cells/uL   Basophils Absolute 0 0 - 200 cells/uL   Neutrophils Relative % 65 %   Lymphocytes Relative 22 %   Monocytes Relative 8 %   Eosinophils Relative 5 %   Basophils Relative 0 %   Smear Review Criteria for review not met   COMPLETE METABOLIC PANEL WITH GFR  Result Value Ref Range   Sodium 138 135 - 146 mmol/L   Potassium 4.3 3.5 - 5.3 mmol/L   Chloride 104 98 - 110 mmol/L   CO2 21 20 - 31 mmol/L   Glucose, Bld 84 65 - 99 mg/dL   BUN 15 7 - 25 mg/dL   Creat 0.82 0.50 - 1.10 mg/dL   Total Bilirubin 0.3 0.2 - 1.2 mg/dL   Alkaline Phosphatase 55 33 - 115 U/L   AST 14 10 - 30 U/L   ALT 15 6 - 29 U/L   Total Protein 7.0 6.1 - 8.1 g/dL   Albumin 3.9 3.6 - 5.1 g/dL   Calcium 8.9 8.6 - 10.2 mg/dL   GFR, Est African American >89 >=60 mL/min   GFR, Est Non African American >89 >=60 mL/min  TSH  Result Value Ref Range   TSH 4.24 mIU/L  Urinalysis w microscopic + reflex cultur  Result Value Ref Range   Color, Urine YELLOW YELLOW   APPearance CLEAR CLEAR   Specific Gravity, Urine 1.021 1.001 - 1.035   pH 7.0 5.0 - 8.0   Glucose, UA NEGATIVE NEGATIVE   Bilirubin Urine NEGATIVE NEGATIVE   Ketones, ur NEGATIVE NEGATIVE   Hgb urine dipstick NEGATIVE NEGATIVE   Protein, ur TRACE (A) NEGATIVE   Nitrite POSITIVE (A) NEGATIVE   Leukocytes, UA 3+ (A) NEGATIVE   WBC, UA >60 (A) <=5 WBC/HPF   RBC / HPF 0-2 <=2 RBC/HPF   Squamous Epithelial / LPF 0-5 <=5 HPF   Bacteria, UA MANY (A) NONE SEEN HPF   Crystals NONE SEEN NONE SEEN HPF   Casts NONE SEEN NONE SEEN LPF   Yeast NONE SEEN NONE SEEN HPF  Hemoglobin A1c  Result Value Ref Range   Hgb A1c MFr Bld 5.3 <5.7 %   Mean Plasma Glucose 105 mg/dL  Ferritin  Result Value Ref Range   Ferritin 155 (H) 10 - 154 ng/mL  VITAMIN D 25 Hydroxy (Vit-D Deficiency, Fractures)  Result Value Ref Range  Vit D, 25-Hydroxy 24 (L) 30 - 100 ng/mL  Lipid panel  Result Value Ref Range   Cholesterol 152 <200 mg/dL   Triglycerides 65 <150  mg/dL   HDL 46 (L) >50 mg/dL   Total CHOL/HDL Ratio 3.3 <5.0 Ratio   VLDL 13 <30 mg/dL   LDL Cholesterol 93 <100 mg/dL      Assessment & Plan:   Problem List Items Addressed This Visit    None    Visit Diagnoses    Acute bacterial conjunctivitis of left eye    -  Primary   explained diagnosis; very contagious; use topical ophth antibiotics; out of work today and tomorrow   Acute cystitis without hematuria       recheck urine today      Follow up plan: No Follow-up on file.  An after-visit summary was printed and given to the patient at [place].  Please see the patient instructions which may contain other information and recommendations beyond what is mentioned above in the assessment and plan.  No orders of the defined types were placed in this encounter.   No orders of the defined types were placed in this encounter.

## 2016-11-01 LAB — URINALYSIS W MICROSCOPIC + REFLEX CULTURE
BILIRUBIN URINE: NEGATIVE
Bacteria, UA: NONE SEEN [HPF]
CRYSTALS: NONE SEEN [HPF]
Casts: NONE SEEN [LPF]
Glucose, UA: NEGATIVE
KETONES UR: NEGATIVE
NITRITE: NEGATIVE
PH: 7 (ref 5.0–8.0)
SPECIFIC GRAVITY, URINE: 1.028 (ref 1.001–1.035)
Yeast: NONE SEEN [HPF]

## 2016-11-02 LAB — URINE CULTURE: ORGANISM ID, BACTERIA: NO GROWTH

## 2016-11-05 ENCOUNTER — Other Ambulatory Visit: Payer: Self-pay | Admitting: Family Medicine

## 2016-11-05 ENCOUNTER — Encounter: Payer: Self-pay | Admitting: Family Medicine

## 2016-11-05 DIAGNOSIS — R3129 Other microscopic hematuria: Secondary | ICD-10-CM

## 2016-11-05 DIAGNOSIS — R8290 Unspecified abnormal findings in urine: Secondary | ICD-10-CM

## 2016-11-05 NOTE — Telephone Encounter (Signed)
Kristeen Miss, please cancel the urology referral

## 2016-11-14 ENCOUNTER — Encounter: Payer: Self-pay | Admitting: Family Medicine

## 2016-11-15 MED ORDER — SULFAMETHOXAZOLE-TRIMETHOPRIM 800-160 MG PO TABS: 1.0000 | ORAL_TABLET | Freq: Two times a day (BID) | ORAL | 0 refills | Status: AC

## 2016-11-27 ENCOUNTER — Encounter: Payer: BC Managed Care – PPO | Admitting: Certified Nurse Midwife

## 2016-12-17 ENCOUNTER — Other Ambulatory Visit: Payer: Self-pay

## 2016-12-17 MED ORDER — LEVOTHYROXINE SODIUM 112 MCG PO TABS: ORAL_TABLET | ORAL | 9 refills | Status: DC

## 2017-01-29 ENCOUNTER — Other Ambulatory Visit: Payer: Self-pay | Admitting: Bariatrics

## 2017-01-30 ENCOUNTER — Telehealth: Payer: Self-pay | Admitting: Gastroenterology

## 2017-01-30 ENCOUNTER — Encounter: Payer: Self-pay | Admitting: Family Medicine

## 2017-01-30 NOTE — Telephone Encounter (Signed)
Patient was referred by Dr. Duke Salvia for gastric sleeve and was wanting to know if she could schedule it for 02/05/17.

## 2017-01-31 ENCOUNTER — Telehealth: Payer: Self-pay

## 2017-01-31 ENCOUNTER — Other Ambulatory Visit: Payer: Self-pay

## 2017-01-31 DIAGNOSIS — Z9884 Bariatric surgery status: Secondary | ICD-10-CM

## 2017-01-31 NOTE — Telephone Encounter (Signed)
Pts call has been returned. She has been scheduled an EGD with Dr. Allen Norris on 02/06/17 in [place] at [facility].

## 2017-01-31 NOTE — Telephone Encounter (Signed)
Pts EGD has been scheduled May 17th [place] with Dr. Allen Norris.

## 2017-02-03 ENCOUNTER — Encounter: Payer: Self-pay | Admitting: *Deleted

## 2017-02-03 ENCOUNTER — Telehealth: Payer: Self-pay | Admitting: Gastroenterology

## 2017-02-03 ENCOUNTER — Encounter: Payer: Self-pay | Admitting: Family Medicine

## 2017-02-03 ENCOUNTER — Telehealth: Payer: Self-pay

## 2017-02-03 NOTE — Telephone Encounter (Signed)
Pts EGD has been moved to [facility] 02/04/17 with Dr. Allen Norris from [facility] due to BMI threshold.  Pt has been informed of scheduling change and has been asked to call Endo after 1:30 today to confirm time of arrival for tomorrow.

## 2017-02-03 NOTE — Telephone Encounter (Signed)
02/03/17 Spoke with Angelita at [facility] and NO prior auth is required for EGD [id number].

## 2017-02-04 ENCOUNTER — Ambulatory Visit
Admission: RE | Admit: 2017-02-04 | Discharge: 2017-02-04 | Disposition: A | Discharge status: Home / Self Care | Payer: BC Managed Care – PPO | Source: Ambulatory Visit | Attending: Gastroenterology | Admitting: Gastroenterology

## 2017-02-04 ENCOUNTER — Ambulatory Visit: Payer: BC Managed Care – PPO | Admitting: Anesthesiology

## 2017-02-04 ENCOUNTER — Encounter: Payer: Self-pay | Admitting: *Deleted

## 2017-02-04 ENCOUNTER — Encounter
Admission: RE | Disposition: A | Discharge status: Home / Self Care | Payer: Self-pay | Source: Ambulatory Visit | Attending: Gastroenterology

## 2017-02-04 DIAGNOSIS — Z9049 Acquired absence of other specified parts of digestive tract: Secondary | ICD-10-CM | POA: Diagnosis not present

## 2017-02-04 DIAGNOSIS — Z01818 Encounter for other preprocedural examination: Secondary | ICD-10-CM

## 2017-02-04 DIAGNOSIS — K2950 Unspecified chronic gastritis without bleeding: Secondary | ICD-10-CM | POA: Insufficient documentation

## 2017-02-04 DIAGNOSIS — Z79899 Other long term (current) drug therapy: Secondary | ICD-10-CM | POA: Diagnosis not present

## 2017-02-04 DIAGNOSIS — Z9884 Bariatric surgery status: Secondary | ICD-10-CM

## 2017-02-04 DIAGNOSIS — K2980 Duodenitis without bleeding: Secondary | ICD-10-CM

## 2017-02-04 DIAGNOSIS — E039 Hypothyroidism, unspecified: Secondary | ICD-10-CM | POA: Diagnosis not present

## 2017-02-04 DIAGNOSIS — Z6841 Body Mass Index (BMI) 40.0 and over, adult: Secondary | ICD-10-CM | POA: Diagnosis not present

## 2017-02-04 DIAGNOSIS — I10 Essential (primary) hypertension: Secondary | ICD-10-CM | POA: Diagnosis not present

## 2017-02-04 HISTORY — DX: Hypothyroidism, unspecified: E03.9

## 2017-02-04 HISTORY — PX: ESOPHAGOGASTRODUODENOSCOPY (EGD) WITH PROPOFOL: SHX5813

## 2017-02-04 LAB — POCT PREGNANCY, URINE: Preg Test, Ur: NEGATIVE

## 2017-02-04 SURGERY — ESOPHAGOGASTRODUODENOSCOPY (EGD) WITH PROPOFOL
Anesthesia: General

## 2017-02-04 MED ORDER — FENTANYL CITRATE (PF) 100 MCG/2ML IJ SOLN
INTRAMUSCULAR | Status: DC | PRN
  Administered 2017-02-04: 50 ug via INTRAVENOUS

## 2017-02-04 MED ORDER — LIDOCAINE HCL (PF) 1 % IJ SOLN
2.0000 mL | Freq: Once | INTRAMUSCULAR | Status: AC
  Administered 2017-02-04: 0.3 mL via INTRADERMAL

## 2017-02-04 MED ORDER — LIDOCAINE HCL (CARDIAC) 20 MG/ML IV SOLN
INTRAVENOUS | Status: DC | PRN
  Administered 2017-02-04: 40 mg via INTRAVENOUS

## 2017-02-04 MED ORDER — PROPOFOL 500 MG/50ML IV EMUL
INTRAVENOUS | Status: DC | PRN
  Administered 2017-02-04: 150 ug/kg/min via INTRAVENOUS

## 2017-02-04 MED ORDER — SODIUM CHLORIDE 0.9 % IV SOLN
INTRAVENOUS | Status: DC
  Administered 2017-02-04: 1000 mL via INTRAVENOUS

## 2017-02-04 MED ORDER — PROPOFOL 10 MG/ML IV BOLUS
INTRAVENOUS | Status: DC | PRN
  Administered 2017-02-04: 80 mg via INTRAVENOUS

## 2017-02-04 MED ORDER — LIDOCAINE HCL (PF) 1 % IJ SOLN
INTRAMUSCULAR | Status: AC
  Administered 2017-02-04: 0.3 mL via INTRADERMAL
  Filled 2017-02-04: qty 2

## 2017-02-04 MED ORDER — GLYCOPYRROLATE 0.2 MG/ML IJ SOLN
INTRAMUSCULAR | Status: DC | PRN
  Administered 2017-02-04: .2 mg via INTRAVENOUS

## 2017-02-04 MED ORDER — MIDAZOLAM HCL 2 MG/2ML IJ SOLN
INTRAMUSCULAR | Status: DC | PRN
  Administered 2017-02-04: 1 mg via INTRAVENOUS

## 2017-02-04 NOTE — Transfer of Care (Signed)
Immediate Anesthesia Transfer of Care Note  Patient: Deborah Munoz  Procedure(s) Performed: Procedure(s): ESOPHAGOGASTRODUODENOSCOPY (EGD) WITH PROPOFOL (N/A)  Patient Location: PACU  Anesthesia Type:General  Level of Consciousness: awake, alert  and oriented  Airway & Oxygen Therapy: Patient Spontanous Breathing and Patient connected to nasal cannula oxygen  Post-op Assessment: Report given to RN and Post -op Vital signs reviewed and stable  Post vital signs: Reviewed and stable  Last Vitals:  Vitals:   02/04/17 1217 02/04/17 1218  BP: 104/64 104/64  Pulse: 86 86  Resp: 13 16  Temp: 36.6 C 36.6 C    Last Pain:  Vitals:   02/04/17 1218  TempSrc: Tympanic         Complications: No apparent anesthesia complications

## 2017-02-04 NOTE — Anesthesia Post-op Follow-up Note (Cosign Needed)
Anesthesia QCDR form completed.        

## 2017-02-04 NOTE — H&P (Signed)
Deborah Lame, MD Deborah Munoz[address] Deborah Munoz, [place] Munoz [id number] Phone:[phone] Fax : [phone]  Primary Care Physician:  Deborah Courser, MD Primary Gastroenterologist:  Dr. Allen Munoz  Pre-Procedure History & Physical: HPI:  Deborah Munoz is a 31 y.o. female is here for an endoscopy.   Past Medical History:  Diagnosis Date  . History of gallstones   . Hypertension   . Hypothyroidism   . Morbid obesity ([place]) 05/08/2012  . Thyroid disease     Past Surgical History:  Procedure Laterality Date  . CHOLECYSTECTOMY    . HAND SURGERY      Prior to Admission medications   Medication Sig Start Date End Date Taking? Authorizing Provider  Apple Cider Vinegar 600 MG CAPS Take by mouth.    [provider]  cyanocobalamin 2000 MCG tablet Take 2,000 mcg by mouth daily.    [provider]  escitalopram (LEXAPRO) 10 MG tablet Take 1 tablet (10 mg total) by mouth daily. 05/17/16   Deborah Courser, MD  hydrochlorothiazide (HYDRODIURIL) 25 MG tablet TAKE 1 TABLET (25 MG TOTAL) BY MOUTH DAILY. 10/23/16   Deborah Courser, MD  hydrOXYzine (VISTARIL) 25 MG capsule Take 1 capsule (25 mg total) by mouth 3 (three) times daily as needed for anxiety. 05/17/16   Deborah Munoz, Deborah Anis, MD  levothyroxine (SYNTHROID, LEVOTHROID) 112 MCG tablet TAKE 1 TABLET (112 MCG TOTAL) BY MOUTH DAILY. 12/17/16   Deborah Courser, MD  Multiple Vitamin (MULTIVITAMIN) tablet Take 1 tablet by mouth daily. Reported on 10/20/2015    [provider]    Allergies as of 02/03/2017 - Review Complete 02/03/2017  Allergen Reaction Noted  . Shrimp [shellfish allergy]  10/18/2016    Family History  Problem Relation Age of Onset  . Hypertension Mother   . Diabetes Mother   . Diabetes Father   . Hypertension Father   . Heart disease Maternal Aunt   . Heart disease Maternal Aunt   . Diabetes Maternal Grandmother   . Hypertension Maternal Grandmother   . Cancer Maternal Grandfather        bone  .  Diabetes Paternal Grandmother   . Hypertension Paternal Grandmother     Social History   Social History  . Marital status: Married    Spouse name: N/A  . Number of children: N/A  . Years of education: N/A   Occupational History  . Not on file.   Social History Main Topics  . Smoking status: Never Smoker  . Smokeless tobacco: Never Used  . Alcohol use 0.0 oz/week     Comment: occasionally  . Drug use: No  . Sexual activity: Yes   Other Topics Concern  . Not on file   Social History Narrative  . No narrative on file    Review of Systems: See HPI, otherwise negative ROS  Physical Exam: BP (!) 143/83   Pulse 89   Temp 97.7 F (36.5 C) (Tympanic)   Resp 16   Ht 5\' 3"  (1.6 m)   Wt 290 lb (131.5 kg)   SpO2 100%   BMI 51.37 kg/m  General:   Alert,  pleasant and cooperative in NAD Head:  Normocephalic and atraumatic. Neck:  Supple; no masses or thyromegaly. Lungs:  Clear throughout to auscultation.    Heart:  Regular rate and rhythm. Abdomen:  Soft, nontender and nondistended. Normal bowel sounds, without guarding, and without rebound.   Neurologic:  Alert and  oriented x4;  grossly normal neurologically.  Impression/Plan: Deborah Munoz is here for an endoscopy to be performed for preop for bariatric surgery.  Risks, benefits, limitations, and alternatives regarding  endoscopy have been reviewed with the patient.  Questions have been answered.  All parties agreeable.   Deborah Lame, MD  02/04/2017, 11:54 AM

## 2017-02-04 NOTE — Op Note (Signed)
[facility] Gastroenterology Patient Name: Deborah Munoz Procedure Date: 02/04/2017 12:08 PM MRN: [id number] Account #: [id number] Date of Birth: 1985-09-30 Admit Type: Outpatient Age: 31 Room: [facility] ENDO ROOM 4 Gender: Female Note Status: Finalized Procedure:            Upper GI endoscopy Indications:          Preoperative assessment for bariatric surgery to treat                        morbid obesity Providers:            Lucilla Lame MD, MD Referring MD:         Venia Carbon. Tyner (Referring MD) Medicines:            Propofol per Anesthesia Complications:        No immediate complications. Procedure:            Pre-Anesthesia Assessment:                       - Prior to the procedure, a History and Physical was                        performed, and patient medications and allergies were                        reviewed. The patient's tolerance of previous                        anesthesia was also reviewed. The risks and benefits of                        the procedure and the sedation options and risks were                        discussed with the patient. All questions were                        answered, and informed consent was obtained. Prior                        Anticoagulants: The patient has taken no previous                        anticoagulant or antiplatelet agents. ASA Grade                        Assessment: II - A patient with mild systemic disease.                        After reviewing the risks and benefits, the patient was                        deemed in satisfactory condition to undergo the                        procedure.                       After obtaining informed consent, the endoscope was  passed under direct vision. Throughout the procedure,                        the patient's blood pressure, pulse, and oxygen                        saturations were monitored continuously. The Endoscope   was introduced through the mouth, and advanced to the                        second part of duodenum. The upper GI endoscopy was                        accomplished without difficulty. The patient tolerated                        the procedure well. Findings:      The examined esophagus was normal.      The entire examined stomach was normal. Biopsies were taken with a cold       forceps for histology.      Localized mild inflammation characterized by erythema was found in the       duodenal bulb. Impression:           - Normal esophagus.                       - Normal stomach. Biopsied. There was no bile seen in                        the stomach.                       - Duodenitis. Recommendation:       - Await pathology results.                       - Discharge patient to home.                       - Resume previous diet.                       - Continue present medications.                       - The patient is cleared for bariatric surgery from a                        GI point of view pending the gastric biopsies. Procedure Code(s):    --- Professional ---                       [phone], Esophagogastroduodenoscopy, flexible, transoral;                        with biopsy, single or multiple Diagnosis Code(s):    --- Professional ---                       B34.193, Encounter for other preprocedural examination                       E66.01, Morbid (severe) obesity due to excess calories  K29.80, Duodenitis without bleeding CPT copyright 2016 American Medical Association. All rights reserved. The codes documented in this report are preliminary and upon coder review may  be revised to meet current compliance requirements. Lucilla Lame MD, MD 02/04/2017 12:15:26 PM This report has been signed electronically. Number of Addenda: 0 Note Initiated On: 02/04/2017 12:08 PM      [facility]

## 2017-02-04 NOTE — Anesthesia Procedure Notes (Signed)
Date/Time: 02/04/2017 12:00 PM Performed by: Hedda Slade Pre-anesthesia Checklist: Patient identified, Emergency Drugs available, Suction available and Patient being monitored Patient Re-evaluated:Patient Re-evaluated prior to inductionOxygen Delivery Method: Nasal cannula

## 2017-02-04 NOTE — Anesthesia Preprocedure Evaluation (Signed)
Anesthesia Evaluation  Patient identified by MRN, date of birth, ID band Patient awake    Reviewed: Allergy & Precautions, H&P , NPO status , Patient's Chart, lab work & pertinent test results, reviewed documented beta blocker date and time   Airway Mallampati: II   Neck ROM: full    Dental  (+) Poor Dentition   Pulmonary neg pulmonary ROS,    Pulmonary exam normal        Cardiovascular hypertension, negative cardio ROS Normal cardiovascular exam Rhythm:regular Rate:Normal     Neuro/Psych PSYCHIATRIC DISORDERS  Neuromuscular disease negative neurological ROS  negative psych ROS   GI/Hepatic negative GI ROS, Neg liver ROS,   Endo/Other  negative endocrine ROSHypothyroidism Morbid obesity  Renal/GU negative Renal ROS  negative genitourinary   Musculoskeletal   Abdominal   Peds  Hematology negative hematology ROS (+)   Anesthesia Other Findings Past Medical History: No date: History of gallstones No date: Hypertension 05/08/2012: Morbid obesity ([place]) No date: Thyroid disease Past Surgical History: No date: CHOLECYSTECTOMY No date: HAND SURGERY   Reproductive/Obstetrics negative OB ROS                             Anesthesia Physical Anesthesia Plan  ASA: III  Anesthesia Plan: General   Post-op Pain Management:    Induction:   Airway Management Planned:   Additional Equipment:   Intra-op Plan:   Post-operative Plan:   Informed Consent: I have reviewed the patients History and Physical, chart, labs and discussed the procedure including the risks, benefits and alternatives for the proposed anesthesia with the patient or authorized representative who has indicated his/her understanding and acceptance.   Dental Advisory Given  Plan Discussed with: CRNA  Anesthesia Plan Comments:         Anesthesia Quick Evaluation

## 2017-02-05 ENCOUNTER — Ambulatory Visit
Admission: RE | Admit: 2017-02-05 | Discharge: 2017-02-05 | Disposition: A | Discharge status: Home / Self Care | Payer: BC Managed Care – PPO | Source: Ambulatory Visit | Attending: Bariatrics | Admitting: Bariatrics

## 2017-02-05 ENCOUNTER — Encounter: Payer: Self-pay | Admitting: Gastroenterology

## 2017-02-05 LAB — SURGICAL PATHOLOGY

## 2017-02-05 NOTE — Anesthesia Postprocedure Evaluation (Signed)
Anesthesia Post Note  Patient: Deborah Munoz  Procedure(s) Performed: Procedure(s) (LRB): ESOPHAGOGASTRODUODENOSCOPY (EGD) WITH PROPOFOL (N/A)  Patient location during evaluation: PACU Anesthesia Type: General Level of consciousness: awake and alert Pain management: pain level controlled Vital Signs Assessment: post-procedure vital signs reviewed and stable Respiratory status: spontaneous breathing, nonlabored ventilation, respiratory function stable and patient connected to nasal cannula oxygen Cardiovascular status: blood pressure returned to baseline and stable Postop Assessment: no signs of nausea or vomiting Anesthetic complications: no     Last Vitals:  Vitals:   02/04/17 1238 02/04/17 1248  BP: 108/76 120/76  Pulse: 84 72  Resp: 16 15  Temp:      Last Pain:  Vitals:   02/04/17 1218  TempSrc: Tympanic                 Molli Barrows

## 2017-02-06 ENCOUNTER — Encounter: Payer: Self-pay | Admitting: Gastroenterology

## 2017-02-06 ENCOUNTER — Encounter: Admission: RE | Payer: Self-pay | Source: Ambulatory Visit

## 2017-02-06 ENCOUNTER — Ambulatory Visit
Admission: RE | Admit: 2017-02-06 | Payer: BC Managed Care – PPO | Source: Ambulatory Visit | Admitting: Gastroenterology

## 2017-02-06 SURGERY — ESOPHAGOGASTRODUODENOSCOPY (EGD) WITH PROPOFOL
Anesthesia: Choice

## 2017-03-27 HISTORY — PX: BARIATRIC SURGERY: SHX1103

## 2017-04-17 ENCOUNTER — Encounter: Payer: Self-pay | Admitting: Family Medicine

## 2017-04-17 ENCOUNTER — Ambulatory Visit (INDEPENDENT_AMBULATORY_CARE_PROVIDER_SITE_OTHER): Payer: BC Managed Care – PPO | Admitting: Family Medicine

## 2017-04-17 DIAGNOSIS — I10 Essential (primary) hypertension: Secondary | ICD-10-CM | POA: Diagnosis not present

## 2017-04-17 DIAGNOSIS — E039 Hypothyroidism, unspecified: Secondary | ICD-10-CM | POA: Diagnosis not present

## 2017-04-17 NOTE — Assessment & Plan Note (Signed)
Off of antihypertensive at this time after bariatric surgery

## 2017-04-17 NOTE — Assessment & Plan Note (Signed)
Losing weight after bariatric surgery; encouragement given

## 2017-04-17 NOTE — Progress Notes (Signed)
BP 122/64   Pulse 95   Temp 97.7 F (36.5 C) (Oral)   Resp 14   Wt 286 lb 4.8 oz (129.9 kg)   SpO2 94%   BMI 50.72 kg/m    Subjective:    Patient ID: Deborah Munoz, female    DOB: 11/09/1985, 31 y.o.   MRN: [id number]  HPI: Deborah Munoz is a 31 y.o. female  Chief Complaint  Patient presents with  . 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 History of gallstones   . Hypertension   . Hypothyroidism   . Morbid obesity ([place]) 05/08/2012  . Thyroid disease    Past Surgical History:  Procedure Laterality Date  . BARIATRIC SURGERY     july 5   . CHOLECYSTECTOMY    . ESOPHAGOGASTRODUODENOSCOPY (EGD) WITH PROPOFOL N/A 02/04/2017   Procedure: ESOPHAGOGASTRODUODENOSCOPY (EGD) WITH PROPOFOL;  Surgeon: Lucilla Lame, MD;  [facility];  Service: Endoscopy;  Laterality: N/A;  . HAND SURGERY     Family History  Problem Relation Age of Onset  . Hypertension Mother   . Diabetes Mother   . Diabetes Father   . Hypertension Father     . Heart disease Maternal Aunt   . Heart disease Maternal Aunt   . Diabetes Maternal Grandmother   . Hypertension Maternal Grandmother   . Cancer Maternal Grandfather        bone  . Diabetes Paternal Grandmother   . Hypertension Paternal Grandmother    Social History   Social History  . Marital status: Married    Spouse name: N/A  . Number of children: N/A  . Years of education: N/A   Occupational History  . Not on file.   Social History Main Topics  . Smoking status: Never Smoker  . Smokeless tobacco: Never Used  . Alcohol use 0.0 oz/week     Comment: occasionally  . Drug use: No  . Sexual activity: Yes   Other Topics Concern  . Not on file   Social History Narrative  . No narrative on file    Interim medical history since last visit reviewed. Allergies and medications reviewed  Review of Systems Per HPI unless specifically indicated above     Objective:    BP 122/64   Pulse 95   Temp 97.7 F (36.5 C) (Oral)   Resp 14   Wt 286 lb 4.8 oz (129.9 kg)   SpO2  94%   BMI 50.72 kg/m   Wt Readings from Last 3 Encounters:  04/17/17 286 lb 4.8 oz (129.9 kg)  02/04/17 290 lb (131.5 kg)  10/31/16 288 lb 9.6 oz (130.9 kg)    Physical Exam  Constitutional: She appears well-developed and well-nourished.  HENT:  Mouth/Throat: Mucous membranes are normal.  Eyes: EOM are normal. No scleral icterus.  Cardiovascular: Normal rate and regular rhythm.   Pulmonary/Chest: Effort normal and breath sounds normal.  Abdominal: Normal appearance and bowel sounds are normal.  Five port sites healing well, no erythema, no drainage, sites are C/D/I  Psychiatric: She has a normal mood and affect. Her behavior is normal.       Assessment & Plan:   Problem List Items Addressed This Visit      Cardiovascular and Mediastinum   Hypertension (Chronic)    Off of antihypertensive at this time after bariatric surgery        Endocrine   Hypothyroidism    Continue current dose;  bariatric surgery will be checking labs apparently; may need lower dose in future after significant weight loss, so will want to monitor periodically        Other   Morbid obesity ([place]) (Chronic)    Losing weight after bariatric surgery; encouragement given          Follow up plan: No Follow-up on file.  An after-visit summary was printed and given to the patient at [facility].  Please see the patient instructions which may contain other information and recommendations beyond what is mentioned above in the assessment and plan.  Meds ordered this encounter  Medications  . Cyanocobalamin (NASCOBAL) 500 MCG/0.1ML SOLN    Sig: Place 1 spray into the nose once a week.  Marland Kitchen DISCONTD: levothyroxine (SYNTHROID, LEVOTHROID) 112 MCG tablet    Sig: Take 1 tablet by mouth daily.  Marland Kitchen DISCONTD: pediatric multivitamin-fluoride (POLY-VI-FLOR) 0.25 MG chewable tablet    Sig: Chew 1 tablet by mouth daily.  . calcium citrate-vitamin D (CALCIUM CITRATE CHEWY BITE) 500-500 MG-UNIT chewable tablet    Sig: Chew 1 tablet by mouth daily.  . Iron Carbonyl-Vitamin C-FOS 30-10-25 MG CHEW    Sig: Chew 18 mg by mouth 3 (three) times daily.    No orders of the defined types were placed in this encounter.

## 2017-04-17 NOTE — Patient Instructions (Signed)
I am so proud of you Call me if I can be of any help

## 2017-04-17 NOTE — Assessment & Plan Note (Signed)
Continue current dose; bariatric surgery will be checking labs apparently; may need lower dose in future after significant weight loss, so will want to monitor periodically

## 2017-05-20 ENCOUNTER — Telehealth: Payer: Self-pay | Admitting: Family Medicine

## 2017-05-20 NOTE — Telephone Encounter (Signed)
Going through uncompleted lab orders Patient never came back in for recheck urine I extended those orders (entered Feb 13th) Please ask her to come in for recheck soon Thank you

## 2017-05-21 ENCOUNTER — Other Ambulatory Visit: Payer: Self-pay

## 2017-05-21 DIAGNOSIS — R8290 Unspecified abnormal findings in urine: Secondary | ICD-10-CM

## 2017-05-21 NOTE — Telephone Encounter (Signed)
Left detailed voicemail

## 2017-06-10 ENCOUNTER — Telehealth: Payer: Self-pay | Admitting: Family Medicine

## 2017-06-10 NOTE — Telephone Encounter (Signed)
Pt is requesting a order for tb testing. Would like to come in Tuesday afternoon to get it done. Please call once order is ready

## 2017-06-11 ENCOUNTER — Other Ambulatory Visit: Payer: Self-pay

## 2017-06-11 DIAGNOSIS — Z789 Other specified health status: Secondary | ICD-10-CM

## 2017-06-11 DIAGNOSIS — Z111 Encounter for screening for respiratory tuberculosis: Secondary | ICD-10-CM

## 2017-06-17 ENCOUNTER — Other Ambulatory Visit: Payer: Self-pay

## 2017-06-17 DIAGNOSIS — Z111 Encounter for screening for respiratory tuberculosis: Secondary | ICD-10-CM

## 2017-06-17 DIAGNOSIS — Z789 Other specified health status: Secondary | ICD-10-CM

## 2017-06-18 ENCOUNTER — Encounter: Payer: Self-pay | Admitting: Family Medicine

## 2017-06-18 ENCOUNTER — Other Ambulatory Visit: Payer: Self-pay | Admitting: Family Medicine

## 2017-06-18 ENCOUNTER — Telehealth: Payer: Self-pay | Admitting: Family Medicine

## 2017-06-18 DIAGNOSIS — R8281 Pyuria: Secondary | ICD-10-CM

## 2017-06-18 DIAGNOSIS — R3129 Other microscopic hematuria: Secondary | ICD-10-CM

## 2017-06-18 LAB — VARICELLA ZOSTER ANTIBODY, IGG: Varicella IgG: 3260 index

## 2017-06-18 MED ORDER — NITROFURANTOIN MONOHYD MACRO 100 MG PO CAPS: 100.0000 mg | ORAL_CAPSULE | Freq: Two times a day (BID) | ORAL | 0 refills | Status: DC

## 2017-06-18 NOTE — Telephone Encounter (Signed)
Thank you I responded already The blood was probably due to her menses, but she had bacteria and WBCs so needs the ABX

## 2017-06-18 NOTE — Telephone Encounter (Signed)
Pt has sent you a message via mychart but she is asking that I also sent this message. Pt is needing clarification. She gave urine specimen on yesterday and her cycle was on. You prescribed an antibiotic. Pt just wanted to make sure that her cycle did not throw her numbers off. Please check her mychart message.

## 2017-06-18 NOTE — Progress Notes (Signed)
Start ABX Recheck urine in 2 weeks

## 2017-06-19 ENCOUNTER — Encounter: Payer: Self-pay | Admitting: Family Medicine

## 2017-06-19 LAB — URINALYSIS W MICROSCOPIC + REFLEX CULTURE
BILIRUBIN URINE: NEGATIVE
Glucose, UA: NEGATIVE
Hyaline Cast: NONE SEEN /LPF
Nitrites, Initial: NEGATIVE
PH: 6 (ref 5.0–8.0)
Specific Gravity, Urine: 1.027 (ref 1.001–1.03)

## 2017-06-19 LAB — URINE CULTURE
MICRO NUMBER:: 81066351
SPECIMEN QUALITY:: ADEQUATE

## 2017-06-19 LAB — CULTURE INDICATED

## 2017-06-20 ENCOUNTER — Ambulatory Visit: Payer: BC Managed Care – PPO

## 2017-06-20 LAB — QUANTIFERON TB GOLD ASSAY (BLOOD)
QUANTIFERON NIL VALUE: 0.33 [IU]/mL
QUANTIFERON(R)-TB GOLD: NEGATIVE
Quantiferon Tb Ag Minus Nil Value: <0 IU/mL

## 2017-06-20 NOTE — Telephone Encounter (Signed)
Please follow-up on that

## 2017-06-25 ENCOUNTER — Ambulatory Visit (INDEPENDENT_AMBULATORY_CARE_PROVIDER_SITE_OTHER): Payer: Commercial Managed Care - PPO

## 2017-06-25 DIAGNOSIS — Z23 Encounter for immunization: Secondary | ICD-10-CM

## 2017-09-22 ENCOUNTER — Encounter: Payer: Self-pay | Admitting: Family Medicine

## 2017-10-01 ENCOUNTER — Encounter: Payer: Self-pay | Admitting: Family Medicine

## 2017-10-01 DIAGNOSIS — Z3201 Encounter for pregnancy test, result positive: Secondary | ICD-10-CM

## 2017-10-02 ENCOUNTER — Other Ambulatory Visit: Payer: Self-pay

## 2017-10-02 DIAGNOSIS — Z3201 Encounter for pregnancy test, result positive: Secondary | ICD-10-CM

## 2017-10-02 DIAGNOSIS — R3129 Other microscopic hematuria: Secondary | ICD-10-CM

## 2017-10-02 DIAGNOSIS — R8281 Pyuria: Secondary | ICD-10-CM

## 2017-10-03 LAB — PREGNANCY, URINE: PREG TEST UR: NEGATIVE

## 2017-10-04 ENCOUNTER — Other Ambulatory Visit: Payer: Self-pay | Admitting: Family Medicine

## 2017-10-04 LAB — URINALYSIS W MICROSCOPIC + REFLEX CULTURE
Bilirubin Urine: NEGATIVE
Glucose, UA: NEGATIVE
HGB URINE DIPSTICK: NEGATIVE
Nitrites, Initial: NEGATIVE
PROTEIN: NEGATIVE
Specific Gravity, Urine: 1.025 (ref 1.001–1.03)
pH: >=8.5 — AB (ref 5.0–8.0)

## 2017-10-04 LAB — URINE CULTURE
MICRO NUMBER: 90046138
SPECIMEN QUALITY: ADEQUATE

## 2017-10-04 LAB — CULTURE INDICATED

## 2017-10-04 MED ORDER — NITROFURANTOIN MONOHYD MACRO 100 MG PO CAPS: 100.0000 mg | ORAL_CAPSULE | Freq: Two times a day (BID) | ORAL | 0 refills | Status: DC

## 2017-10-04 NOTE — Progress Notes (Signed)
ABX to [facility] in case we miss each other and she is having symptoms (it is Saturday)

## 2017-10-08 ENCOUNTER — Other Ambulatory Visit: Payer: Self-pay

## 2017-10-08 DIAGNOSIS — E039 Hypothyroidism, unspecified: Secondary | ICD-10-CM

## 2017-10-08 MED ORDER — LEVOTHYROXINE SODIUM 112 MCG PO TABS: ORAL_TABLET | ORAL | 0 refills | Status: DC

## 2017-10-08 NOTE — Telephone Encounter (Signed)
It's time to see patient for her thyroid; last labs were January 2018 I refilled her medicine and we'll be glad to check labs at her visit in the next month Thank you

## 2017-10-17 ENCOUNTER — Ambulatory Visit: Payer: Self-pay | Admitting: Family Medicine

## 2017-11-09 NOTE — Telephone Encounter (Signed)
Closing out old MyChart message

## 2017-11-09 NOTE — Telephone Encounter (Signed)
Closing out old MyChart message from over a year ago

## 2017-11-11 ENCOUNTER — Encounter: Payer: Self-pay | Admitting: Family Medicine

## 2017-11-14 ENCOUNTER — Ambulatory Visit: Payer: Commercial Managed Care - PPO | Admitting: Family Medicine

## 2017-11-14 ENCOUNTER — Encounter: Payer: Self-pay | Admitting: Family Medicine

## 2017-11-14 VITALS — BP 142/74 | HR 88 | Temp 98.4°F | Wt 264.0 lb

## 2017-11-14 DIAGNOSIS — I10 Essential (primary) hypertension: Secondary | ICD-10-CM | POA: Diagnosis not present

## 2017-11-14 DIAGNOSIS — R82998 Other abnormal findings in urine: Secondary | ICD-10-CM

## 2017-11-14 DIAGNOSIS — R102 Pelvic and perineal pain: Secondary | ICD-10-CM

## 2017-11-14 DIAGNOSIS — R5383 Other fatigue: Secondary | ICD-10-CM | POA: Diagnosis not present

## 2017-11-14 DIAGNOSIS — Z8742 Personal history of other diseases of the female genital tract: Secondary | ICD-10-CM

## 2017-11-14 DIAGNOSIS — E559 Vitamin D deficiency, unspecified: Secondary | ICD-10-CM

## 2017-11-14 DIAGNOSIS — R1031 Right lower quadrant pain: Secondary | ICD-10-CM | POA: Diagnosis not present

## 2017-11-14 DIAGNOSIS — R7989 Other specified abnormal findings of blood chemistry: Secondary | ICD-10-CM

## 2017-11-14 DIAGNOSIS — E039 Hypothyroidism, unspecified: Secondary | ICD-10-CM

## 2017-11-14 DIAGNOSIS — Z9884 Bariatric surgery status: Secondary | ICD-10-CM | POA: Insufficient documentation

## 2017-11-14 DIAGNOSIS — R399 Unspecified symptoms and signs involving the genitourinary system: Secondary | ICD-10-CM | POA: Diagnosis not present

## 2017-11-14 DIAGNOSIS — L83 Acanthosis nigricans: Secondary | ICD-10-CM | POA: Diagnosis not present

## 2017-11-14 LAB — POCT URINALYSIS DIPSTICK
Appearance: NORMAL
GLUCOSE UA: NEGATIVE
Ketones, UA: NEGATIVE
Nitrite, UA: NEGATIVE
Protein, UA: NEGATIVE
RBC UA: NEGATIVE
Spec Grav, UA: 1.01 (ref 1.010–1.025)
Urobilinogen, UA: 0.2 E.U./dL
pH, UA: 8 (ref 5.0–8.0)

## 2017-11-14 MED ORDER — NITROFURANTOIN MONOHYD MACRO 100 MG PO CAPS: 100.0000 mg | ORAL_CAPSULE | Freq: Two times a day (BID) | ORAL | 0 refills | Status: DC

## 2017-11-14 NOTE — Assessment & Plan Note (Signed)
recheck

## 2017-11-14 NOTE — Assessment & Plan Note (Signed)
Check labs 

## 2017-11-14 NOTE — Assessment & Plan Note (Signed)
DASH guidelines

## 2017-11-14 NOTE — Assessment & Plan Note (Signed)
Check TSH 

## 2017-11-14 NOTE — Progress Notes (Signed)
BP (!) 142/74 (BP Location: Right Arm, Patient Position: Sitting, Cuff Size: Large)   Pulse 88   Temp 98.4 F (36.9 C) (Oral)   Wt 264 lb (119.7 kg)   LMP 11/01/2017 Comment: Last period was not normal   SpO2 99%   BMI 46.77 kg/m    Subjective:    Patient ID: Deborah Munoz, female    DOB: 05/10/1986, 32 y.o.   MRN: [id number]  HPI: Deborah Munoz is a 32 y.o. female  Chief Complaint  Patient presents with  . Urinary Tract Infection    UTI sx  . Chest Pain    happneded yesterday bvelieces that this may be anxiety related     HPI Patient is here for an acute visit  She thinks she has a urinary tract infection  She also had chest pain yesterday; she was just sitting in her car before class; had an anatomy practical; just getting extra study in before she took it; felt like a squeezing in her chest really tight; dropped her book; took a few breaths and it went away, then happened again; nursing teacher checked her BP 151/115; 148/95, not clammy, no dizziness, no nausea; just the squeezing; not really a dull ache the rest of the day, just faint ache; pain was a 7 out of 10 pain when it happened; no SHOB; nothing similar; she has a cardiologist; saw one cardiologist a while back; maternal side has heart condition; uncle has pacemaker; 3 out of 8 kiids had this inherited heart condition; patient was told she does not have it; Dr. 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 History of gallstones   . Hypertension   . Hypothyroidism   . Morbid obesity ([place]) 05/08/2012  . Thyroid disease    Past Surgical History:  Procedure Laterality Date  . BARIATRIC SURGERY     july 5   . CHOLECYSTECTOMY    . ESOPHAGOGASTRODUODENOSCOPY (EGD) WITH PROPOFOL N/A 02/04/2017   Procedure: ESOPHAGOGASTRODUODENOSCOPY (EGD) WITH PROPOFOL;  Surgeon: Lucilla Lame, MD;  Location: [facility] ENDOSCOPY;  Service: Endoscopy;  Laterality: N/A;  . HAND SURGERY     Family History  Problem Relation Age of Onset  . Hypertension Mother   . Diabetes Mother   . Diabetes Father   . Hypertension Father   . Heart disease Maternal Aunt   . Heart disease Maternal Aunt   . Diabetes Maternal Grandmother   . Hypertension Maternal Grandmother   . Cancer Maternal Grandfather        bone  . Diabetes Paternal Grandmother   . Hypertension Paternal Grandmother    Social History   Tobacco Use  . Smoking status: Never Smoker  . Smokeless tobacco: Never Used  Substance Use Topics  .  Alcohol use: Yes    Alcohol/week: 0.0 oz    Comment: occasionally  . Drug use: No    Interim medical history since last visit reviewed. Allergies and medications reviewed  Review of Systems  Respiratory: Negative for shortness of breath.   Cardiovascular: Positive for chest pain and leg swelling (just a little a few weeks ago). Negative for palpitations.  Gastrointestinal: Negative for nausea (not during chest pain).  Neurological: Positive for headaches.   Per HPI unless specifically indicated above     Objective:    BP (!) 142/74 (BP Location: Right Arm, Patient Position: Sitting, Cuff Size: Large)   Pulse 88   Temp 98.4 F (36.9 C) (Oral)   Wt 264 lb (119.7 kg)   LMP 11/01/2017 Comment: Last period was not normal   SpO2 99%   BMI 46.77 kg/m       Physical Exam  Constitutional: She appears well-developed and well-nourished.  HENT:  Mouth/Throat: Mucous membranes are normal.  Eyes: EOM are normal. No scleral icterus.  Cardiovascular: Normal rate and regular rhythm.  Pulmonary/Chest: Effort normal and breath sounds normal.  Psychiatric: She has a normal mood and affect. Her behavior is normal.    Urine ordered, reviewed     Assessment & Plan:   Problem List Items Addressed This Visit      Cardiovascular and Mediastinum   Hypertension (Chronic)    DASH guidelines      Relevant Orders   Lipid panel (Completed)     Endocrine   Hypothyroidism    Check TSH      Relevant Orders   TSH (Completed)     Musculoskeletal and Integument   Acanthosis nigricans    Check A1c      Relevant Orders   Hemoglobin A1c (Completed)     Other   Vitamin D deficiency    Check vit D      Relevant Orders   VITAMIN D 25 Hydroxy (Vit-D Deficiency, Fractures) (Completed)   High serum ferritin    recheck      Relevant Orders   Ferritin (Completed)   Bariatric surgery status    Check labs      Relevant Orders   B12 (Completed)    Other Visit Diagnoses    UTI symptoms    -  Primary   Relevant Orders   POCT urinalysis dipstick (Completed)   Urine Culture (Completed)   CBC with Differential/Platelet (Completed)   COMPLETE METABOLIC PANEL WITH GFR (Completed)   Leukocytes in urine       Relevant Orders   Urine Culture (Completed)   Other fatigue       Abdominal pain, right lower quadrant       Relevant Orders   CBC with Differential/Platelet (Completed)   COMPLETE METABOLIC PANEL WITH GFR (Completed)   Pelvic pain       Relevant Orders   US Transvaginal Non-OB   US Pelvis Complete   Hx of ovarian cyst       Relevant Orders   US Transvaginal Non-OB   US Pelvis Complete       Follow up plan: No Follow-up on file.  An after-visit summary was printed and given to the patient at [place].  Please see the patient  instructions which may contain other information and recommendations beyond what is mentioned above in the assessment and plan.  Meds ordered this encounter  Medications  . DISCONTD: nitrofurantoin, macrocrystal-monohydrate, (MACROBID) 100 MG capsule    Sig: Take 1 capsule (  100 mg total) by mouth 2 (two) times daily.    Dispense:  10 capsule    Refill:  0    Orders Placed This Encounter  Procedures  . Urine Culture  . US Transvaginal Non-OB  . US Pelvis Complete  . CBC with Differential/Platelet  . COMPLETE METABOLIC PANEL WITH GFR  . Hemoglobin A1c  . Lipid panel  . TSH  . VITAMIN D 25 Hydroxy (Vit-D Deficiency, Fractures)  . B12  . Ferritin  . POCT urinalysis dipstick

## 2017-11-14 NOTE — Assessment & Plan Note (Signed)
Check A1c. 

## 2017-11-14 NOTE — Assessment & Plan Note (Signed)
Check vit D 

## 2017-11-14 NOTE — Patient Instructions (Addendum)
Try to follow the DASH guidelines (DASH stands for Dietary Approaches to Stop Hypertension). Try to limit the sodium in your diet to no more than 1,500mg of sodium per day. Certainly try to not exceed 2,000 mg per day at the very most. Do not add salt when cooking or at the table.  Check the sodium amount on labels when shopping, and choose items lower in sodium when given a choice. Avoid or limit foods that already contain a lot of sodium. Eat a diet rich in fruits and vegetables and whole grains, and try to lose weight if overweight or obese  DASH Eating Plan DASH stands for "Dietary Approaches to Stop Hypertension." The DASH eating plan is a healthy eating plan that has been shown to reduce high blood pressure (hypertension). It may also reduce your risk for type 2 diabetes, heart disease, and stroke. The DASH eating plan may also help with weight loss. What are tips for following this plan? General guidelines  Avoid eating more than 2,300 mg (milligrams) of salt (sodium) a day. If you have hypertension, you may need to reduce your sodium intake to 1,500 mg a day.  Limit alcohol intake to no more than 1 drink a day for nonpregnant women and 2 drinks a day for men. One drink equals 12 oz of beer, 5 oz of wine, or 1 oz of hard liquor.  Work with your health care provider to maintain a healthy body weight or to lose weight. Ask what an ideal weight is for you.  Get at least 30 minutes of exercise that causes your heart to beat faster (aerobic exercise) most days of the week. Activities may include walking, swimming, or biking.  Work with your health care provider or diet and nutrition specialist (dietitian) to adjust your eating plan to your individual calorie needs. Reading food labels  Check food labels for the amount of sodium per serving. Choose foods with less than 5 percent of the Daily Value of sodium. Generally, foods with less than 300 mg of sodium per serving fit into this eating  plan.  To find whole grains, look for the word "whole" as the first word in the ingredient list. Shopping  Buy products labeled as "low-sodium" or "no salt added."  Buy fresh foods. Avoid canned foods and premade or frozen meals. Cooking  Avoid adding salt when cooking. Use salt-free seasonings or herbs instead of table salt or sea salt. Check with your health care provider or pharmacist before using salt substitutes.  Do not fry foods. Cook foods using healthy methods such as baking, boiling, grilling, and broiling instead.  Cook with heart-healthy oils, such as olive, canola, soybean, or sunflower oil. Meal planning   Eat a balanced diet that includes: ? 5 or more servings of fruits and vegetables each day. At each meal, try to fill half of your plate with fruits and vegetables. ? Up to 6-8 servings of whole grains each day. ? Less than 6 oz of lean meat, poultry, or fish each day. A 3-oz serving of meat is about the same size as a deck of cards. One egg equals 1 oz. ? 2 servings of low-fat dairy each day. ? A serving of nuts, seeds, or beans 5 times each week. ? Heart-healthy fats. Healthy fats called Omega-3 fatty acids are found in foods such as flaxseeds and coldwater fish, like sardines, salmon, and mackerel.  Limit how much you eat of the following: ? Canned or prepackaged foods. ? Food that   is high in trans fat, such as fried foods. ? Food that is high in saturated fat, such as fatty meat. ? Sweets, desserts, sugary drinks, and other foods with added sugar. ? Full-fat dairy products.  Do not salt foods before eating.  Try to eat at least 2 vegetarian meals each week.  Eat more home-cooked food and less restaurant, buffet, and fast food.  When eating at a restaurant, ask that your food be prepared with less salt or no salt, if possible. What foods are recommended? The items listed may not be a complete list. Talk with your dietitian about what dietary choices are best  for you. Grains Whole-grain or whole-wheat bread. Whole-grain or whole-wheat pasta. Brown rice. Oatmeal. Quinoa. Bulgur. Whole-grain and low-sodium cereals. Pita bread. Low-fat, low-sodium crackers. Whole-wheat flour tortillas. Vegetables Fresh or frozen vegetables (raw, steamed, roasted, or grilled). Low-sodium or reduced-sodium tomato and vegetable juice. Low-sodium or reduced-sodium tomato sauce and tomato paste. Low-sodium or reduced-sodium canned vegetables. Fruits All fresh, dried, or frozen fruit. Canned fruit in natural juice (without added sugar). Meat and other protein foods Skinless chicken or turkey. Ground chicken or turkey. Pork with fat trimmed off. Fish and seafood. Egg whites. Dried beans, peas, or lentils. Unsalted nuts, nut butters, and seeds. Unsalted canned beans. Lean cuts of beef with fat trimmed off. Low-sodium, lean deli meat. Dairy Low-fat (1%) or fat-free (skim) milk. Fat-free, low-fat, or reduced-fat cheeses. Nonfat, low-sodium ricotta or cottage cheese. Low-fat or nonfat yogurt. Low-fat, low-sodium cheese. Fats and oils Soft margarine without trans fats. Vegetable oil. Low-fat, reduced-fat, or light mayonnaise and salad dressings (reduced-sodium). Canola, safflower, olive, soybean, and sunflower oils. Avocado. Seasoning and other foods Herbs. Spices. Seasoning mixes without salt. Unsalted popcorn and pretzels. Fat-free sweets. What foods are not recommended? The items listed may not be a complete list. Talk with your dietitian about what dietary choices are best for you. Grains Baked goods made with fat, such as croissants, muffins, or some breads. Dry pasta or rice meal packs. Vegetables Creamed or fried vegetables. Vegetables in a cheese sauce. Regular canned vegetables (not low-sodium or reduced-sodium). Regular canned tomato sauce and paste (not low-sodium or reduced-sodium). Regular tomato and vegetable juice (not low-sodium or reduced-sodium). Pickles.  Olives. Fruits Canned fruit in a light or heavy syrup. Fried fruit. Fruit in cream or butter sauce. Meat and other protein foods Fatty cuts of meat. Ribs. Fried meat. Bacon. Sausage. Bologna and other processed lunch meats. Salami. Fatback. Hotdogs. Bratwurst. Salted nuts and seeds. Canned beans with added salt. Canned or smoked fish. Whole eggs or egg yolks. Chicken or turkey with skin. Dairy Whole or 2% milk, cream, and half-and-half. Whole or full-fat cream cheese. Whole-fat or sweetened yogurt. Full-fat cheese. Nondairy creamers. Whipped toppings. Processed cheese and cheese spreads. Fats and oils Butter. Stick margarine. Lard. Shortening. Ghee. Bacon fat. Tropical oils, such as coconut, palm kernel, or palm oil. Seasoning and other foods Salted popcorn and pretzels. Onion salt, garlic salt, seasoned salt, table salt, and sea salt. Worcestershire sauce. Tartar sauce. Barbecue sauce. Teriyaki sauce. Soy sauce, including reduced-sodium. Steak sauce. Canned and packaged gravies. Fish sauce. Oyster sauce. Cocktail sauce. Horseradish that you find on the shelf. Ketchup. Mustard. Meat flavorings and tenderizers. Bouillon cubes. Hot sauce and Tabasco sauce. Premade or packaged marinades. Premade or packaged taco seasonings. Relishes. Regular salad dressings. Where to find more information:  National Heart, Lung, and Blood Institute: www.nhlbi.nih.gov  American Heart Association: www.heart.org Summary  The DASH eating plan is a healthy   eating plan that has been shown to reduce high blood pressure (hypertension). It may also reduce your risk for type 2 diabetes, heart disease, and stroke.  With the DASH eating plan, you should limit salt (sodium) intake to 2,300 mg a day. If you have hypertension, you may need to reduce your sodium intake to 1,500 mg a day.  When on the DASH eating plan, aim to eat more fresh fruits and vegetables, whole grains, lean proteins, low-fat dairy, and heart-healthy  fats.  Work with your health care provider or diet and nutrition specialist (dietitian) to adjust your eating plan to your individual calorie needs. This information is not intended to replace advice given to you by your health care provider. Make sure you discuss any questions you have with your health care provider. Document Released: 08/29/2011 Document Revised: 09/02/2016 Document Reviewed: 09/02/2016 Elsevier Interactive Patient Education  2018 [employer]  

## 2017-11-15 LAB — COMPLETE METABOLIC PANEL WITH GFR
AG RATIO: 1.3 (calc) (ref 1.0–2.5)
ALT: 13 U/L (ref 6–29)
AST: 15 U/L (ref 10–30)
Albumin: 4.1 g/dL (ref 3.6–5.1)
Alkaline phosphatase (APISO): 68 U/L (ref 33–115)
BILIRUBIN TOTAL: 0.5 mg/dL (ref 0.2–1.2)
BUN: 15 mg/dL (ref 7–25)
CALCIUM: 9.3 mg/dL (ref 8.6–10.2)
CHLORIDE: 100 mmol/L (ref 98–110)
CO2: 27 mmol/L (ref 20–32)
Creat: 0.74 mg/dL (ref 0.50–1.10)
GFR, EST AFRICAN AMERICAN: 125 mL/min/{1.73_m2} (ref >=60)
GFR, EST NON AFRICAN AMERICAN: 108 mL/min/{1.73_m2} (ref >=60)
GLOBULIN: 3.1 g/dL (ref 1.9–3.7)
Glucose, Bld: 84 mg/dL (ref 65–99)
POTASSIUM: 4.4 mmol/L (ref 3.5–5.3)
SODIUM: 136 mmol/L (ref 135–146)
TOTAL PROTEIN: 7.2 g/dL (ref 6.1–8.1)

## 2017-11-15 LAB — CBC WITH DIFFERENTIAL/PLATELET
BASOS PCT: 0.8 %
Basophils Absolute: 52 cells/uL (ref 0–200)
Eosinophils Absolute: 221 cells/uL (ref 15–500)
Eosinophils Relative: 3.4 %
HEMATOCRIT: 38.6 % (ref 35.0–45.0)
Hemoglobin: 12.8 g/dL (ref 11.7–15.5)
LYMPHS ABS: 1638 {cells}/uL (ref 850–3900)
MCH: 24.8 pg — ABNORMAL LOW (ref 27.0–33.0)
MCHC: 33.2 g/dL (ref 32.0–36.0)
MCV: 74.8 fL — AB (ref 80.0–100.0)
MPV: 9.5 fL (ref 7.5–12.5)
Monocytes Relative: 7.3 %
NEUTROS PCT: 63.3 %
Neutro Abs: 4115 cells/uL (ref 1500–7800)
PLATELETS: 357 10*3/uL (ref 140–400)
RBC: 5.16 10*6/uL — ABNORMAL HIGH (ref 3.80–5.10)
RDW: 13.4 % (ref 11.0–15.0)
TOTAL LYMPHOCYTE: 25.2 %
WBC: 6.5 10*3/uL (ref 3.8–10.8)
WBCMIX: 475 {cells}/uL (ref 200–950)

## 2017-11-15 LAB — LIPID PANEL
Cholesterol: 179 mg/dL (ref <200)
HDL: 52 mg/dL (ref >50)
LDL Cholesterol (Calc): 110 mg/dL (calc) — ABNORMAL HIGH
NON-HDL CHOLESTEROL (CALC): 127 mg/dL (ref <130)
TRIGLYCERIDES: 77 mg/dL (ref <150)
Total CHOL/HDL Ratio: 3.4 (calc) (ref <5.0)

## 2017-11-15 LAB — URINE CULTURE
MICRO NUMBER:: 90237484
SPECIMEN QUALITY:: ADEQUATE

## 2017-11-15 LAB — HEMOGLOBIN A1C
EAG (MMOL/L): 6.2 (calc)
HEMOGLOBIN A1C: 5.5 %{Hb} (ref <5.7)
MEAN PLASMA GLUCOSE: 111 (calc)

## 2017-11-15 LAB — VITAMIN D 25 HYDROXY (VIT D DEFICIENCY, FRACTURES): Vit D, 25-Hydroxy: 58 ng/mL (ref 30–100)

## 2017-11-15 LAB — VITAMIN B12: Vitamin B-12: 1312 pg/mL — ABNORMAL HIGH (ref 200–1100)

## 2017-11-15 LAB — FERRITIN: FERRITIN: 109 ng/mL (ref 10–154)

## 2017-11-15 LAB — TSH: TSH: 1.74 m[IU]/L

## 2017-11-19 ENCOUNTER — Ambulatory Visit: Payer: Self-pay | Admitting: Family Medicine

## 2017-11-24 ENCOUNTER — Ambulatory Visit: Payer: Commercial Managed Care - PPO | Admitting: Family Medicine

## 2017-11-24 ENCOUNTER — Encounter: Payer: Self-pay | Admitting: Family Medicine

## 2017-11-24 DIAGNOSIS — R718 Other abnormality of red blood cells: Secondary | ICD-10-CM

## 2017-11-24 DIAGNOSIS — E559 Vitamin D deficiency, unspecified: Secondary | ICD-10-CM

## 2017-11-24 DIAGNOSIS — E039 Hypothyroidism, unspecified: Secondary | ICD-10-CM

## 2017-11-24 DIAGNOSIS — R7989 Other specified abnormal findings of blood chemistry: Secondary | ICD-10-CM | POA: Diagnosis not present

## 2017-11-24 DIAGNOSIS — Z9884 Bariatric surgery status: Secondary | ICD-10-CM | POA: Diagnosis not present

## 2017-11-24 MED ORDER — RANITIDINE HCL 300 MG PO TABS: 300.0000 mg | ORAL_TABLET | Freq: Every day | ORAL | 11 refills | Status: DC

## 2017-11-24 NOTE — Assessment & Plan Note (Signed)
Chronic issue, like inherited

## 2017-11-24 NOTE — Assessment & Plan Note (Signed)
Praise given for weight loss 

## 2017-11-24 NOTE — Patient Instructions (Addendum)
Please do let your bariatric surgeon know if the new medicine doesn't take care of your heartburn / reflux Try to limit or avoid triggers like coffee, caffeinated beverages, onions, chocolate, spicy foods, peppermint, acidic foods like pizza, spaghetti sauce, and orange juice Lose weight if you are overweight or obese Try elevating the head of your bed by placing a small wedge between your mattress and box springs to keep acid in the stomach at night instead of coming up into your esophagus Avoid shellfish (shrimp, etc.) and let your servers know at restaurants what you cannot tolerate Let us know of any unresolved issues

## 2017-11-24 NOTE — Assessment & Plan Note (Signed)
Plan to check TSH again in a few months; may need need as much with weight loss

## 2017-11-24 NOTE — Assessment & Plan Note (Signed)
With heartburn after surgery; start H2 blocker; if not improving, patient to notify her bariatric surgeon in case EGD is needed

## 2017-11-24 NOTE — Assessment & Plan Note (Signed)
resolved 

## 2017-11-24 NOTE — Assessment & Plan Note (Signed)
Continue supplement. 

## 2017-11-24 NOTE — Progress Notes (Signed)
BP 118/70 (BP Location: Left Arm, Patient Position: Sitting, Cuff Size: Large)   Pulse 94   Temp 98.1 F (36.7 C) (Oral)   Wt 260 lb 8 oz (118.2 kg)   LMP 11/01/2017 Comment: Last period was not normal   SpO2 99%   BMI 46.15 kg/m    Subjective:    Patient ID: Deborah Munoz, female    DOB: 11/29/1985, 32 y.o.   MRN: [id number]  HPI: Deborah Munoz is a 32 y.o. female  Chief Complaint  Patient presents with  . Follow-up    HPI Patient is here for follow-up She is doing much better No chest pains; just the one episode before the anatomy practical; thinks it was all anxiety  She was just not feeling good; had a few UTIs back to back; did not clear up all the way Had a weird lumpy feeling, uncomfortable to lay on her stomach; like the princess and the pea; no real pain; has an appt with GYN to discuss that; used to have cysts which would come and go; those would usually be gone after a few weeks, but this is persistent No burning with urination, never did; large amounts and frequently; drinking plenty; no fevers  Class this morning at 8 am; higher heart rate noticed this morning by NP; had some caffeine last night; no palpitations, no SHOB, no light-headedness, no dizziness; hypothyroidism; not feeling hot all the time; feels exhausted, but not sleeping enough  Hx of vit D deficiency; taking supplement OTC  Terrible acid reflux, eating tums like candy after her bariatric surgery; asked about medicine; no blood in the stool; no hoarseness or chronic cough, no problems swallowing  Cannot eat shellfish but tolerating the pills okay  Depression screen [facility] 2/9 11/24/2017 11/14/2017 04/17/2017 10/31/2016 10/18/2016  Decreased Interest 0 0 0 0 0  Down, Depressed, Hopeless 0 0 0 0 0  PHQ - 2 Score 0 0 0 0 0    Relevant past medical, surgical, family and social history reviewed Past Medical History:  Diagnosis Date  . History of gallstones   . Hypertension   . Hypothyroidism   .  Morbid obesity ([place]) 05/08/2012  . Thyroid disease    Past Surgical History:  Procedure Laterality Date  . BARIATRIC SURGERY     july 5   . CHOLECYSTECTOMY    . ESOPHAGOGASTRODUODENOSCOPY (EGD) WITH PROPOFOL N/A 02/04/2017   Procedure: ESOPHAGOGASTRODUODENOSCOPY (EGD) WITH PROPOFOL;  Surgeon: Lucilla Lame, MD;  [facility] ENDOSCOPY;  Service: Endoscopy;  Laterality: N/A;  . HAND SURGERY     Family History  Problem Relation Age of Onset  . Hypertension Mother   . Diabetes Mother   . Diabetes Father   . Hypertension Father   . Heart disease Maternal Aunt   . Heart disease Maternal Aunt   . Diabetes Maternal Grandmother   . Hypertension Maternal Grandmother   . Cancer Maternal Grandfather        bone  . Diabetes Paternal Grandmother   . Hypertension Paternal Grandmother    Social History   Tobacco Use  . Smoking status: Never Smoker  . Smokeless tobacco: Never Used  Substance Use Topics  . Alcohol use: Yes    Alcohol/week: 0.0 oz    Comment: occasionally  . Drug use: No   Interim medical history since last visit reviewed. Allergies and medications reviewed  Review of Systems Per HPI unless specifically indicated above     Objective:    BP 118/70 (  BP Location: Left Arm, Patient Position: Sitting, Cuff Size: Large)   Pulse 94   Temp 98.1 F (36.7 C) (Oral)   Wt 260 lb 8 oz (118.2 kg)   LMP 11/01/2017 Comment: Last period was not normal   SpO2 99%   BMI 46.15 kg/m   Wt Readings from Last 3 Encounters:  11/24/17 260 lb 8 oz (118.2 kg)  11/14/17 264 lb (119.7 kg)  04/17/17 286 lb 4.8 oz (129.9 kg)    Physical Exam  Constitutional: She appears well-developed and well-nourished.  HENT:  Mouth/Throat: Mucous membranes are normal.  Eyes: EOM are normal. No scleral icterus.  Cardiovascular: Normal rate and regular rhythm.  Pulmonary/Chest: Effort normal and breath sounds normal.  Abdominal: Normal appearance and bowel sounds are normal.  Psychiatric: She has  a normal mood and affect. Her behavior is normal. Her mood appears not anxious. She does not exhibit a depressed mood.    Results for orders placed or performed in visit on 11/14/17  Urine Culture  Result Value Ref Range   MICRO NUMBER: [id number]    SPECIMEN QUALITY: ADEQUATE    Sample Source URINE    STATUS: FINAL    Result:      Multiple organisms present, each less than 10,000 CFU/mL. These organisms, commonly found on external and internal genitalia, are considered to be colonizers. No further testing performed.  CBC with Differential/Platelet  Result Value Ref Range   WBC 6.5 3.8 - 10.8 Thousand/uL   RBC 5.16 (H) 3.80 - 5.10 Million/uL   Hemoglobin 12.8 11.7 - 15.5 g/dL   HCT 38.6 35.0 - 45.0 %   MCV 74.8 (L) 80.0 - 100.0 fL   MCH 24.8 (L) 27.0 - 33.0 pg   MCHC 33.2 32.0 - 36.0 g/dL   RDW 13.4 11.0 - 15.0 %   Platelets 357 140 - 400 Thousand/uL   MPV 9.5 7.5 - 12.5 fL   Neutro Abs 4,115 1,500 - 7,800 cells/uL   Lymphs Abs 1,638 850 - 3,900 cells/uL   WBC mixed population 475 200 - 950 cells/uL   Eosinophils Absolute 221 15 - 500 cells/uL   Basophils Absolute 52 0 - 200 cells/uL   Neutrophils Relative % 63.3 %   Total Lymphocyte 25.2 %   Monocytes Relative 7.3 %   Eosinophils Relative 3.4 %   Basophils Relative 0.8 %  COMPLETE METABOLIC PANEL WITH GFR  Result Value Ref Range   Glucose, Bld 84 65 - 99 mg/dL   BUN 15 7 - 25 mg/dL   Creat 0.74 0.50 - 1.10 mg/dL   GFR, Est Non African American 108 > OR = 60 mL/min/1.58m2   GFR, Est African American 125 > OR = 60 mL/min/1.61m2   BUN/Creatinine Ratio NOT APPLICABLE 6 - 22 (calc)   Sodium 136 135 - 146 mmol/L   Potassium 4.4 3.5 - 5.3 mmol/L   Chloride 100 98 - 110 mmol/L   CO2 27 20 - 32 mmol/L   Calcium 9.3 8.6 - 10.2 mg/dL   Total Protein 7.2 6.1 - 8.1 g/dL   Albumin 4.1 3.6 - 5.1 g/dL   Globulin 3.1 1.9 - 3.7 g/dL (calc)   AG Ratio 1.3 1.0 - 2.5 (calc)   Total Bilirubin 0.5 0.2 - 1.2 mg/dL   Alkaline phosphatase  (APISO) 68 33 - 115 U/L   AST 15 10 - 30 U/L   ALT 13 6 - 29 U/L  Hemoglobin A1c  Result Value Ref Range   Hgb A1c MFr  Bld 5.5 <5.7 % of total Hgb   Mean Plasma Glucose 111 (calc)   eAG (mmol/L) 6.2 (calc)  Lipid panel  Result Value Ref Range   Cholesterol 179 <200 mg/dL   HDL 52 >50 mg/dL   Triglycerides 77 <150 mg/dL   LDL Cholesterol (Calc) 110 (H) mg/dL (calc)   Total CHOL/HDL Ratio 3.4 <5.0 (calc)   Non-HDL Cholesterol (Calc) 127 <130 mg/dL (calc)  TSH  Result Value Ref Range   TSH 1.74 mIU/L  VITAMIN D 25 Hydroxy (Vit-D Deficiency, Fractures)  Result Value Ref Range   Vit D, 25-Hydroxy 58 30 - 100 ng/mL  B12  Result Value Ref Range   Vitamin B-12 1,312 (H) 200 - 1,100 pg/mL  Ferritin  Result Value Ref Range   Ferritin 109 10 - 154 ng/mL  POCT urinalysis dipstick  Result Value Ref Range   Color, UA dark yellow    Clarity, UA clear    Glucose, UA neg    Bilirubin, UA ++    Ketones, UA neg    Spec Grav, UA 1.010 1.010 - 1.025   Blood, UA neg    pH, UA 8.0 5.0 - 8.0   Protein, UA neg    Urobilinogen, UA 0.2 0.2 or 1.0 E.U./dL   Nitrite, UA neg    Leukocytes, UA Moderate (2+) (A) Negative   Appearance normal    Odor none       Assessment & Plan:   Problem List Items Addressed This Visit      Endocrine   Hypothyroidism    Plan to check TSH again in a few months; may need need as much with weight loss        Other   Vitamin D deficiency    Continue supplement      RBC microcytosis    Chronic issue, like inherited      Morbid obesity (HCC) (Chronic)    Praise given for weight loss      High serum ferritin    resolved      Bariatric surgery status    With heartburn after surgery; start H2 blocker; if not improving, patient to notify her bariatric surgeon in case EGD is needed         Follow up plan: No Follow-up on file.  An after-visit summary was printed and given to the patient at [place].  Please see the patient instructions which  may contain other information and recommendations beyond what is mentioned above in the assessment and plan.  Meds ordered this encounter  Medications  . ranitidine (ZANTAC) 300 MG tablet    Sig: Take 1 tablet (300 mg total) by mouth at bedtime.    Dispense:  30 tablet    Refill:  11    No orders of the defined types were placed in this encounter.

## 2017-12-03 ENCOUNTER — Ambulatory Visit
Admission: RE | Admit: 2017-12-03 | Discharge: 2017-12-03 | Disposition: A | Discharge status: Home / Self Care | Payer: Commercial Managed Care - PPO | Source: Ambulatory Visit | Attending: Family Medicine | Admitting: Family Medicine

## 2017-12-03 DIAGNOSIS — Z8742 Personal history of other diseases of the female genital tract: Secondary | ICD-10-CM

## 2017-12-03 DIAGNOSIS — R102 Pelvic and perineal pain: Secondary | ICD-10-CM | POA: Insufficient documentation

## 2018-04-01 ENCOUNTER — Telehealth: Payer: Self-pay | Admitting: Family Medicine

## 2018-04-01 NOTE — Telephone Encounter (Signed)
Please find out how patient is doing with the ranitidine If her heartburn has completely resolved, I'd like to try reducing the dose If her heartburn is still bothering her, we need to ask her to call her bariatric surgeon to see if she needs an EGD Thank you

## 2018-04-02 NOTE — Telephone Encounter (Signed)
Copied from [place] [id number]. Topic: Quick Communication - Office Called Patient >> Apr 02, 2018  2:33 PM Cathrine Muster, CMA wrote: Reason for CRM: Please find out how patient is doing with the ranitidine If her heartburn has completely resolved, I'd like to try reducing the dose If her heartburn is still bothering her, we need to ask her to call her bariatric surgeon to see if she needs an EGD Thank you >> Apr 02, 2018  2:52 PM Yvette Rack wrote: Pt states that she is doing fine the Jerrye Bushy is gone she doesn't need to change dosage

## 2018-04-02 NOTE — Telephone Encounter (Signed)
Mailbox full

## 2018-04-02 NOTE — Telephone Encounter (Signed)
Left detailed voicemail

## 2018-04-06 ENCOUNTER — Other Ambulatory Visit: Payer: Self-pay | Admitting: Family Medicine

## 2018-04-06 DIAGNOSIS — E039 Hypothyroidism, unspecified: Secondary | ICD-10-CM

## 2018-04-06 NOTE — Telephone Encounter (Signed)
Lab Results  Component Value Date   TSH 1.74 11/14/2017

## 2018-04-13 ENCOUNTER — Encounter: Payer: Self-pay | Admitting: Family Medicine

## 2018-04-14 ENCOUNTER — Ambulatory Visit: Payer: Commercial Managed Care - PPO | Admitting: Family Medicine

## 2018-04-14 ENCOUNTER — Encounter: Payer: Self-pay | Admitting: Family Medicine

## 2018-04-14 DIAGNOSIS — F909 Attention-deficit hyperactivity disorder, unspecified type: Secondary | ICD-10-CM | POA: Diagnosis not present

## 2018-04-14 MED ORDER — AMPHETAMINE-DEXTROAMPHET ER 10 MG PO CP24: 10.0000 mg | ORAL_CAPSULE | Freq: Every day | ORAL | 0 refills | Status: DC

## 2018-04-14 NOTE — Patient Instructions (Addendum)
Start on the new medicine Return in one week for pulse and blood pressure check with Deborah Munoz, and you can give her feedback Living With Attention Deficit Hyperactivity Disorder If you have been diagnosed with attention deficit hyperactivity disorder (ADHD), you may be relieved that you now know why you have felt or behaved a certain way. Still, you may feel overwhelmed about the treatment ahead. You may also wonder how to get the support you need and how to deal with the condition day-to-day. With treatment and support, you can live with ADHD and manage your symptoms. How to manage lifestyle changes Managing stress Stress is your body's reaction to life changes and events, both good and bad. To cope with the stress of an ADHD diagnosis, it may help to:  Learn more about ADHD.  Exercise regularly. Even a short daily walk can lower stress levels.  Participate in training or education programs (including social skills training classes) that teach you to deal with symptoms.  Medicines Your health care provider may suggest certain medicines if he or she feels that they will help to improve your condition. Stimulant medicines are usually prescribed to treat ADHD, and therapy may also be prescribed. It is important to:  Avoid using alcohol and other substances that may prevent your medicines from working properly [facility]).  Talk with your pharmacist or health care provider about all the medicines that you take, their possible side effects, and what medicines are safe to take together.  Make it your goal to take part in all treatment decisions (shared decision-making). Ask about possible side effects of medicines that your health care provider recommends, and tell him or her how you feel about having those side effects. It is best if shared decision-making with your health care provider is part of your total treatment plan.  Relationships To strengthen your relationships with family members  while treating your condition, consider taking part in family therapy. You might also attend self-help groups alone or with a loved one. Be honest about how your symptoms affect your relationships. Make an effort to communicate respectfully instead of fighting, and find ways to show others that you care. Psychotherapy may be useful in helping you cope with how ADHD affects your relationships. How to recognize changes in your condition The following signs may mean that your treatment is working well and your condition is improving:  Consistently being on time for appointments.  Being more organized at home and work.  Other people noticing improvements in your behavior.  Achieving goals that you set for yourself.  Thinking more clearly.  The following signs may mean that your treatment is not working very well:  Feeling impatience or more confusion.  Missing, forgetting, or being late for appointments.  An increasing sense of disorganization and messiness.  More difficulty in reaching goals that you set for yourself.  Loved ones becoming angry or frustrated with you.  Where to find support Talking to others  Keep emotion out of important discussions and speak in a calm, logical way.  Listen closely and patiently to your loved ones. Try to understand their point of view, and try to avoid getting defensive.  Take responsibility for the consequences of your actions.  Ask that others do not take your behaviors personally.  Aim to solve problems as they come up, and express your feelings instead of bottling them up.  Talk openly about what you need from your loved ones and how they can support you.  Consider going to  family therapy sessions or having your family meet with a specialist who deals with ADHD-related behavior problems. Finances Not all insurance plans cover mental health care, so it is important to check with your insurance carrier. If paying for co-pays or counseling  services is a problem, search for a local or county mental health care center. Public mental health care services may be offered there at a low cost or no cost when you are not able to see a private health care provider. If you are taking medicine for ADHD, you may be able to get the generic form, which may be less expensive than brand-name medicine. Some makers of prescription medicines also offer help to patients who cannot afford the medicines that they need. Follow these instructions at home:  Take over-the-counter and prescription medicines only as told by your health care provider. Check with your health care provider before taking any new medicines.  Create structure and an organized atmosphere at home. For example: ? Make a list of tasks, then rank them from most important to least important. Work on one task at a time until your listed tasks are done. ? Make a daily schedule and follow it consistently every day. ? Use an appointment calendar, and check it 2 or 3 times a day to keep on track. Keep it with you when you leave the house. ? Create spaces where you keep certain things, and always put things back in their places after you use them.  Keep all follow-up visits as told by your health care provider. This is important. Questions to ask your health care provider:  What are the risks and benefits of taking medicines?  Would I benefit from therapy?  How often should I follow up with a health care provider? Contact a health care provider if:  You have side effects from your medicines, such as: ? Repeated muscle twitches, coughing, or speech outbursts. ? Sleep problems. ? Loss of appetite. ? Depression. ? New or worsening behavior problems. ? Dizziness. ? Unusually fast heartbeat. ? Stomach pains. ? Headaches. Get help right away if:  You have a severe reaction to a medicine.  Your behavior suddenly gets worse. Summary  With treatment and support, you can live with  ADHD and manage your symptoms.  The medicines that are most often prescribed for ADHD are stimulants.  Consider taking part in family therapy or self-help groups with family members or friends.  When you talk with friends and family about your ADHD, be patient and communicate openly.  Take over-the-counter and prescription medicines only as told by your health care provider. Check with your health care provider before taking any new medicines. This information is not intended to replace advice given to you by your health care provider. Make sure you discuss any questions you have with your health care provider. Document Released: 01/09/2017 Document Revised: 01/09/2017 Document Reviewed: 01/09/2017 Elsevier Interactive Patient Education  [employer].

## 2018-04-14 NOTE — Progress Notes (Signed)
BP 118/82   Pulse 81   Resp 12   Ht 5\' 3"  (1.6 m)   Wt 263 lb (119.3 kg)   LMP 04/06/2018   SpO2 98%   BMI 46.59 kg/m    Subjective:    Patient ID: Deborah Munoz, female    DOB: July 17, 1986, 32 y.o.   MRN: [id number]  HPI: Deborah Munoz is a 32 y.o. female  Chief Complaint  Patient presents with  . ADHD    Staring nursing school    HPI She is here to see about getting back on ADHD medicine Having trouble focusing in class Using apps and time keepers and things to help with structure, but still having difficulty Concentration is an issue Not impulsive Daydreamer; not the hyperactive kind Son has it, both of them really, Deborah Munoz struggles with reading; he was on medicine but it changed his personality; she didn't like seeing his light dimmed; he started middle school today; he has interventions She used to take Strattera but didn't see a lot of change with it, did not help that much; took it for 8-9 months She took Adderall when she was initially tested; just a trial to see if it would help; no bad side effects She works nights No hx of heart problems; has seen cardiologist; she does not have that trait that her family had; just sees him annually for preventive thing; EKGs have been normal  Depression screen [facility] 2/9 04/14/2018 11/24/2017 11/14/2017 04/17/2017 10/31/2016  Decreased Interest 0 0 0 0 0  Down, Depressed, Hopeless 0 0 0 0 0  PHQ - 2 Score 0 0 0 0 0    Relevant past medical, surgical, family and social history reviewed Past Medical History:  Diagnosis Date  . History of gallstones   . Hypertension   . Hypothyroidism   . Morbid obesity ([place]) 05/08/2012  . Thyroid disease    Past Surgical History:  Procedure Laterality Date  . BARIATRIC SURGERY     july 5   . CHOLECYSTECTOMY    . ESOPHAGOGASTRODUODENOSCOPY (EGD) WITH PROPOFOL N/A 02/04/2017   Procedure: ESOPHAGOGASTRODUODENOSCOPY (EGD) WITH PROPOFOL;  Surgeon: Lucilla Lame, MD;  Location[facility] ENDOSCOPY;   Service: Endoscopy;  Laterality: N/A;  . HAND SURGERY     Family History  Problem Relation Age of Onset  . Hypertension Mother   . Diabetes Mother   . Diabetes Father   . Hypertension Father   . Heart disease Maternal Aunt   . Heart disease Maternal Aunt   . Diabetes Maternal Grandmother   . Hypertension Maternal Grandmother   . Cancer Maternal Grandfather        bone  . Diabetes Paternal Grandmother   . Hypertension Paternal Grandmother    Social History   Tobacco Use  . Smoking status: Never Smoker  . Smokeless tobacco: Never Used  Substance Use Topics  . Alcohol use: Yes    Alcohol/week: 0.0 oz    Comment: occasionally  . Drug use: No    Interim medical history since last visit reviewed. Allergies and medications reviewed  Review of Systems Per HPI unless specifically indicated above     Objective:    BP 118/82   Pulse 81   Resp 12   Ht 5\' 3"  (1.6 m)   Wt 263 lb (119.3 kg)   LMP 04/06/2018   SpO2 98%   BMI 46.59 kg/m   Wt Readings from Last 3 Encounters:  04/14/18 263 lb (119.3 kg)  11/24/17 260  lb 8 oz (118.2 kg)  11/14/17 264 lb (119.7 kg)    Physical Exam  Constitutional: She appears well-developed and well-nourished.  HENT:  Mouth/Throat: Mucous membranes are normal.  Eyes: EOM are normal. No scleral icterus.  Cardiovascular: Normal rate and regular rhythm.  Pulmonary/Chest: Effort normal and breath sounds normal.  Neurological: She is alert. She displays no tremor.  Psychiatric: She has a normal mood and affect. Her behavior is normal.      Assessment & Plan:   Problem List Items Addressed This Visit      Other   Adult ADHD    Restart medication; will use Adderall and titrate as needed; [place] phrase encouraged; exercise encouraged          Follow up plan: Return in about 1 week (around 04/21/2018) for blood pressure and pulse recheck with CMA.  An after-visit summary was printed and given to the patient at [place].  Please see the  patient instructions which may contain other information and recommendations beyond what is mentioned above in the assessment and plan.  Meds ordered this encounter  Medications  . amphetamine-dextroamphetamine (ADDERALL XR) 10 MG 24 hr capsule    Sig: Take 1 capsule (10 mg total) by mouth daily.    Dispense:  30 capsule    Refill:  0    No orders of the defined types were placed in this encounter.

## 2018-04-14 NOTE — Assessment & Plan Note (Signed)
Restart medication; will use Adderall and titrate as needed; [place] phrase encouraged; exercise encouraged

## 2018-04-21 ENCOUNTER — Ambulatory Visit: Payer: Commercial Managed Care - PPO

## 2018-04-21 VITALS — BP 132/68 | HR 86

## 2018-04-21 DIAGNOSIS — I10 Essential (primary) hypertension: Secondary | ICD-10-CM

## 2018-05-21 ENCOUNTER — Other Ambulatory Visit: Payer: Self-pay | Admitting: Family Medicine

## 2018-05-21 NOTE — Telephone Encounter (Signed)
Copied from [place] [id number]. Topic: Quick Communication - See Telephone Encounter >> May 21, 2018  9:40 AM Ahmed Prima L wrote: CRM for notification. See Telephone encounter for: 05/21/18.  amphetamine-dextroamphetamine (ADDERALL XR) 10 MG 24 hr capsule, she does want to try this dosage one more month before she has it increased. Thanks!  [id number] - [place], [place] - [address] [place] [address]

## 2018-05-21 NOTE — Telephone Encounter (Signed)
Last OV 7/23

## 2018-05-22 ENCOUNTER — Other Ambulatory Visit: Payer: Self-pay | Admitting: Nurse Practitioner

## 2018-05-22 DIAGNOSIS — F909 Attention-deficit hyperactivity disorder, unspecified type: Secondary | ICD-10-CM

## 2018-05-22 MED ORDER — AMPHETAMINE-DEXTROAMPHET ER 10 MG PO CP24: 10.0000 mg | ORAL_CAPSULE | Freq: Every day | ORAL | 0 refills | Status: DC

## 2018-06-25 LAB — HM PAP SMEAR: HM Pap smear: NEGATIVE

## 2018-06-27 ENCOUNTER — Encounter: Payer: Self-pay | Admitting: Family Medicine

## 2018-06-29 MED ORDER — AMPHETAMINE-DEXTROAMPHET ER 15 MG PO CP24: 15.0000 mg | ORAL_CAPSULE | ORAL | 0 refills | Status: DC

## 2018-08-18 ENCOUNTER — Encounter: Payer: Self-pay | Admitting: Family Medicine

## 2018-08-18 ENCOUNTER — Ambulatory Visit: Payer: Commercial Managed Care - PPO | Admitting: Family Medicine

## 2018-08-18 VITALS — BP 110/72 | HR 88 | Temp 98.6°F | Ht 62.25 in | Wt 248.9 lb

## 2018-08-18 DIAGNOSIS — Z5181 Encounter for therapeutic drug level monitoring: Secondary | ICD-10-CM | POA: Diagnosis not present

## 2018-08-18 DIAGNOSIS — F909 Attention-deficit hyperactivity disorder, unspecified type: Secondary | ICD-10-CM

## 2018-08-18 DIAGNOSIS — E039 Hypothyroidism, unspecified: Secondary | ICD-10-CM

## 2018-08-18 MED ORDER — AMPHETAMINE-DEXTROAMPHET ER 15 MG PO CP24: 15.0000 mg | ORAL_CAPSULE | ORAL | 0 refills | Status: DC

## 2018-08-18 MED ORDER — HALCINONIDE 0.1 % EX CREA: TOPICAL_CREAM | CUTANEOUS | 0 refills | Status: DC

## 2018-08-18 NOTE — Assessment & Plan Note (Signed)
Doing much better on the 15 mg adderall extended version; we are not using that for weight loss; helping her with her studies; no side effects; continue

## 2018-08-18 NOTE — Patient Instructions (Signed)
Check out the information at familydoctor.org entitled "Nutrition for Weight Loss: What You Need to Know about Fad Diets" Try to lose between 1-2 pounds per week by taking in fewer calories and burning off more calories You can succeed by limiting portions, limiting foods dense in calories and fat, becoming more active, and drinking 8 glasses of water a day (64 ounces) Don't skip meals, especially breakfast, as skipping meals may alter your metabolism Do not use over-the-counter weight loss pills or gimmicks that claim rapid weight loss A healthy BMI (or body mass index) is between 18.5 and 24.9 You can calculate your ideal BMI at the NIH website http://www.nhlbi.nih.gov/health/educational/lose_wt/BMI/bmicalc.htm  

## 2018-08-18 NOTE — Assessment & Plan Note (Signed)
Check TSH 

## 2018-08-18 NOTE — Progress Notes (Signed)
BP 110/72   Pulse 88   Temp 98.6 F (37 C)   Ht 5' 2.25" (1.581 m)   Wt 248 lb 14.4 oz (112.9 kg)   LMP 08/17/2018   SpO2 99%   BMI 45.16 kg/m    Subjective:    Patient ID: Deborah Munoz, female    DOB: 11-23-1985, 32 y.o.   MRN: [id number]  HPI: Deborah Munoz is a 32 y.o. female  Chief Complaint  Patient presents with  . 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, back to first shift so that would be better on the eating and activity; she does not eat anything other nuts or cheese at work right now She is doing some walking, not every single day; she has a Higher education careers adviser at [employer] and 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; [place]; it strained the ankle with walking that much; using ice  Hypothyroidism Lab Results  Component Value Date   TSH 1.74 11/14/2017   Normal cholesterol and A1c last check  Gets painful rashes along the bra line; dermatologist gave her Halog 0.1% cream and that helps; gave her a bunch of samples and it works well  Depression screen [facility] 2/9 08/18/2018 04/14/2018 11/24/2017 11/14/2017 04/17/2017  Decreased Interest 0 0 0 0 0  Down, Depressed, Hopeless 0 0 0 0 0  PHQ - 2 Score 0 0 0 0 0  Altered sleeping 0 - - - -  Tired, decreased energy 0 - - - -  Change in appetite 0 - - - -  Feeling bad or failure about yourself  0 - - - -  Trouble concentrating 0 - - - -  Moving slowly or  fidgety/restless 0 - - - -  Suicidal thoughts 0 - - - -  PHQ-9 Score 0 - - - -  Difficult doing work/chores Not difficult at all - - - -   Fall Risk  08/18/2018 04/14/2018 11/24/2017 11/14/2017 04/17/2017  Falls in the past year? 0 No No No No    Relevant past medical, surgical, family and social history reviewed Past Medical History:  Diagnosis Date  . History of gallstones   . Hypertension   . Hypothyroidism   . Morbid obesity ([place]) 05/08/2012  . Thyroid disease    Past Surgical History:  Procedure Laterality Date  . BARIATRIC SURGERY     july 5   . CHOLECYSTECTOMY    . ESOPHAGOGASTRODUODENOSCOPY (EGD) WITH PROPOFOL N/A 02/04/2017   Procedure: ESOPHAGOGASTRODUODENOSCOPY (EGD) WITH PROPOFOL;  Surgeon: Lucilla Lame, MD;  Location: [facility] ENDOSCOPY;  Service: Endoscopy;  Laterality: N/A;  . HAND SURGERY     Family History  Problem Relation Age of Onset  . Hypertension Mother   . Diabetes Mother   . Diabetes Father   . Hypertension Father   . Heart disease Maternal Aunt   .  Heart disease Maternal Aunt   . Diabetes Maternal Grandmother   . Hypertension Maternal Grandmother   . Cancer Maternal Grandfather        bone  . Diabetes Paternal Grandmother   . Hypertension Paternal Grandmother    Social History   Tobacco Use  . Smoking status: Never Smoker  . Smokeless tobacco: Never Used  Substance Use Topics  . Alcohol use: Yes    Alcohol/week: 0.0 standard drinks    Comment: occasionally  . Drug use: No     Office Visit from 08/18/2018 in [facility]  AUDIT-C Score  1      Interim medical history since last visit reviewed. Allergies and medications reviewed  Review of Systems Per HPI unless specifically indicated above     Objective:    BP 110/72   Pulse 88   Temp 98.6 F (37 C)   Ht 5' 2.25" (1.581 m)   Wt 248 lb 14.4 oz (112.9 kg)   LMP 08/17/2018   SpO2 99%   BMI 45.16 kg/m   Wt Readings from Last 3 Encounters:  08/18/18 248 lb 14.4  oz (112.9 kg)  04/14/18 263 lb (119.3 kg)  11/24/17 260 lb 8 oz (118.2 kg)    Physical Exam  Constitutional: She appears well-developed and well-nourished.  Weight loss noted  HENT:  Mouth/Throat: Mucous membranes are normal.  Eyes: EOM are normal. No scleral icterus.  Cardiovascular: Normal rate and regular rhythm.  No extrasystoles are present.  Pulmonary/Chest: Effort normal and breath sounds normal.  Psychiatric: She has a normal mood and affect. Her behavior is normal.       Assessment & Plan:   Problem List Items Addressed This Visit      Endocrine   Hypothyroidism    Check TSH        Other   Adult ADHD - Primary    Doing much better on the 15 mg adderall extended version; we are not using that for weight loss; helping her with her studies; no side effects; continue      Relevant Orders   Urine Drug Screen w/Alc, no confirm (Completed)    Other Visit Diagnoses    Medication monitoring encounter       Relevant Orders   Urine Drug Screen w/Alc, no confirm (Completed)       Follow up plan: Return in about 3 months (around 11/18/2018).  An after-visit summary was printed and given to the patient at [place].  Please see the patient instructions which may contain other information and recommendations beyond what is mentioned above in the assessment and plan.  Meds ordered this encounter  Medications  . Halcinonide 0.1 % CREA    Sig: Use very sparingly on rash up to twice a day; too strong for face, groin, underarms, children    Dispense:  30 g    Refill:  0  . amphetamine-dextroamphetamine (ADDERALL XR) 15 MG 24 hr capsule    Sig: Take 1 capsule by mouth every morning.    Dispense:  30 capsule    Refill:  0    Orders Placed This Encounter  Procedures  . Urine Drug Screen w/Alc, no confirm

## 2018-08-19 LAB — DRUG SCREEN URINE W/ALC, NO CONF
ALCOHOL, ETHYL (U): NEGATIVE
AMPHETAMINES (1000 NG/ML SCRN): POSITIVE — AB
BARBITURATES: NEGATIVE
BENZODIAZEPINES: NEGATIVE
COCAINE METABOLITES: NEGATIVE
MARIJUANA MET (50 ng/mL SCRN): NEGATIVE
METHADONE: NEGATIVE
METHAQUALONE: NEGATIVE
OPIATES: NEGATIVE
PHENCYCLIDINE: NEGATIVE
PROPOXYPHENE: NEGATIVE

## 2018-10-01 ENCOUNTER — Other Ambulatory Visit: Payer: Self-pay | Admitting: Family Medicine

## 2018-10-01 MED ORDER — AMPHETAMINE-DEXTROAMPHET ER 15 MG PO CP24: 15.0000 mg | ORAL_CAPSULE | ORAL | 0 refills | Status: DC

## 2018-10-01 NOTE — Telephone Encounter (Signed)
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

## 2018-10-19 DIAGNOSIS — L709 Acne, unspecified: Secondary | ICD-10-CM | POA: Diagnosis not present

## 2018-11-02 ENCOUNTER — Other Ambulatory Visit: Payer: Self-pay | Admitting: Family Medicine

## 2018-11-02 DIAGNOSIS — E039 Hypothyroidism, unspecified: Secondary | ICD-10-CM

## 2018-11-02 NOTE — Telephone Encounter (Signed)
Lab Results  Component Value Date   TSH 1.74 11/14/2017   I refilled her thyroid medicine She has an appt with me near the end of February Will plan to get TSH then

## 2018-11-04 ENCOUNTER — Encounter: Payer: Self-pay | Admitting: Family Medicine

## 2018-11-05 ENCOUNTER — Other Ambulatory Visit: Payer: Self-pay | Admitting: Family Medicine

## 2018-11-05 MED ORDER — AMPHETAMINE-DEXTROAMPHET ER 15 MG PO CP24: 15.0000 mg | ORAL_CAPSULE | ORAL | 0 refills | Status: DC

## 2018-11-05 NOTE — Telephone Encounter (Signed)
Last visit reviewed She has appt later this month Rx approved

## 2018-11-20 ENCOUNTER — Other Ambulatory Visit: Payer: Self-pay | Admitting: Family Medicine

## 2018-11-20 ENCOUNTER — Encounter: Payer: Self-pay | Admitting: Family Medicine

## 2018-11-20 ENCOUNTER — Ambulatory Visit (INDEPENDENT_AMBULATORY_CARE_PROVIDER_SITE_OTHER): Payer: Managed Care, Other (non HMO) | Admitting: Family Medicine

## 2018-11-20 VITALS — BP 122/70 | HR 93 | Temp 97.9°F | Resp 12 | Ht 63.0 in | Wt 247.9 lb

## 2018-11-20 DIAGNOSIS — Z13220 Encounter for screening for lipoid disorders: Secondary | ICD-10-CM

## 2018-11-20 DIAGNOSIS — I10 Essential (primary) hypertension: Secondary | ICD-10-CM

## 2018-11-20 DIAGNOSIS — L83 Acanthosis nigricans: Secondary | ICD-10-CM

## 2018-11-20 DIAGNOSIS — K219 Gastro-esophageal reflux disease without esophagitis: Secondary | ICD-10-CM | POA: Diagnosis not present

## 2018-11-20 DIAGNOSIS — R7989 Other specified abnormal findings of blood chemistry: Secondary | ICD-10-CM | POA: Diagnosis not present

## 2018-11-20 DIAGNOSIS — F909 Attention-deficit hyperactivity disorder, unspecified type: Secondary | ICD-10-CM

## 2018-11-20 DIAGNOSIS — E039 Hypothyroidism, unspecified: Secondary | ICD-10-CM | POA: Diagnosis not present

## 2018-11-20 DIAGNOSIS — R200 Anesthesia of skin: Secondary | ICD-10-CM

## 2018-11-20 DIAGNOSIS — M542 Cervicalgia: Secondary | ICD-10-CM

## 2018-11-20 DIAGNOSIS — R718 Other abnormality of red blood cells: Secondary | ICD-10-CM

## 2018-11-20 DIAGNOSIS — E559 Vitamin D deficiency, unspecified: Secondary | ICD-10-CM | POA: Diagnosis not present

## 2018-11-20 MED ORDER — FAMOTIDINE 20 MG PO TABS: 20.0000 mg | ORAL_TABLET | Freq: Two times a day (BID) | ORAL | 5 refills | Status: DC | PRN

## 2018-11-20 NOTE — Assessment & Plan Note (Signed)
Check labs, morbid obesity risk for diabetes, hyperlipidemia, insulin resistance, etc

## 2018-11-20 NOTE — Assessment & Plan Note (Signed)
Check ferritin 

## 2018-11-20 NOTE — Progress Notes (Signed)
BP 122/70   Pulse 93   Temp 97.9 F (36.6 C) (Oral)   Resp 12   Ht 5' 3"  (1.6 m)   Wt 247 lb 14.4 oz (112.4 kg)   LMP 10/29/2018   SpO2 94%   BMI 43.91 kg/m    Subjective:    Patient ID: Deborah Munoz, female    DOB: [birth year], 33 y.o.   MRN: [id number]  HPI: Deborah Munoz is a 33 y.o. female  Chief Complaint  Patient presents with  . Follow-up  . paperwork    bio metric screening  . Gastroesophageal Reflux    zantac not helping    HPI Here for a few things GERD; she zantac is not helping; it is not as bad as it has been in the past; having gas pains, makes her very uncomfortable; thought that was from the zantac; only happens when she takes the zantac; stopped the zantac and just tums now; I asked about triggers; spicy foods, tomato-based foods, just some of the time; nothing coming up in the back of the throat; no blood in the stool; no abd pain  Needs biometric screening for work; needs glucose and cholesterol (truly fasting)  Hypothyroidism; little sluggish for the last few weeks, just tired  Lab Results  Component Value Date   TSH 1.74 11/14/2017   Has acne and not sure if related to the adderall; menstrual cycles have been off; stopped taking birth control a little over a year; going [place] [facility]; saw derm and was put on doxycycline and cream and not better  Some numbness along the right pinky; lasted maybe an hour; just noticed it; completely better now; no pain or tingling; no pain in the neck; no trouble with speaking or swallowing or vision changes; on Feb 8th and Feb 15th, she had weird shooting pain in the right side of the head; came and went; down the right side of the neck, happened 6 times and quit, took a baby aspirin; she remembers this same thing happened years ago and she had a scan done and everything was fine she says  Depression screen [facility] 2/9 11/20/2018 08/18/2018 04/14/2018 11/24/2017 11/14/2017  Decreased Interest 0 0 0 0 0  Down,  Depressed, Hopeless 0 0 0 0 0  PHQ - 2 Score 0 0 0 0 0  Altered sleeping 0 0 - - -  Tired, decreased energy 1 0 - - -  Change in appetite 0 0 - - -  Feeling bad or failure about yourself  0 0 - - -  Trouble concentrating 0 0 - - -  Moving slowly or fidgety/restless 0 0 - - -  Suicidal thoughts 0 0 - - -  PHQ-9 Score 1 0 - - -  Difficult doing work/chores Not difficult at all Not difficult at all - - -   Fall Risk  11/20/2018 08/18/2018 04/14/2018 11/24/2017 11/14/2017  Falls in the past year? 0 0 No No No  Number falls in past yr: 0 - - - -  Injury with Fall? 0 - - - -    Relevant past medical, surgical, family and social history reviewed Past Medical History:  Diagnosis Date  . History of gallstones   . Hypertension   . Hypothyroidism   . Morbid obesity ([place]) 05/08/2012  . Thyroid disease    Past Surgical History:  Procedure Laterality Date  . BARIATRIC SURGERY     july 5   . CHOLECYSTECTOMY    .  ESOPHAGOGASTRODUODENOSCOPY (EGD) WITH PROPOFOL N/A 02/04/2017   Procedure: ESOPHAGOGASTRODUODENOSCOPY (EGD) WITH PROPOFOL;  Surgeon: Lucilla Lame, MD;  Location: [facility] ENDOSCOPY;  Service: Endoscopy;  Laterality: N/A;  . HAND SURGERY     Family History  Problem Relation Age of Onset  . Hypertension Mother   . Diabetes Mother   . Diabetes Father   . Hypertension Father   . Heart disease Maternal Aunt   . Heart disease Maternal Aunt   . Diabetes Maternal Grandmother   . Hypertension Maternal Grandmother   . Cancer Maternal Grandfather        bone  . Diabetes Paternal Grandmother   . Hypertension Paternal Grandmother    Social History   Tobacco Use  . Smoking status: Never Smoker  . Smokeless tobacco: Never Used  Substance Use Topics  . Alcohol use: Yes    Alcohol/week: 0.0 standard drinks    Comment: occasionally  . Drug use: No     Office Visit from 11/20/2018 in [facility]  AUDIT-C Score  1      Interim medical history since last visit  reviewed. Allergies and medications reviewed  Review of Systems Per HPI unless specifically indicated above     Objective:    BP 122/70   Pulse 93   Temp 97.9 F (36.6 C) (Oral)   Resp 12   Ht 5' 3"  (1.6 m)   Wt 247 lb 14.4 oz (112.4 kg)   LMP 10/29/2018   SpO2 94%   BMI 43.91 kg/m   Wt Readings from Last 3 Encounters:  11/20/18 247 lb 14.4 oz (112.4 kg)  08/18/18 248 lb 14.4 oz (112.9 kg)  04/14/18 263 lb (119.3 kg)    Physical Exam Constitutional:      Appearance: She is obese.  Neck:     Musculoskeletal: No neck rigidity.  Cardiovascular:     Rate and Rhythm: Normal rate and regular rhythm.  Pulmonary:     Effort: Pulmonary effort is normal.     Breath sounds: Normal breath sounds.  Skin:    Comments: Acne noted on the cheeks, inflammatory and papular  Neurological:     Mental Status: She is alert.     Cranial Nerves: No facial asymmetry.     Comments: UE strength 5/5  Psychiatric:        Mood and Affect: Mood is not anxious or depressed.     Results for orders placed or performed in visit on 08/18/18  Urine Drug Screen w/Alc, no confirm  Result Value Ref Range   Please note     AMPHETAMINES (1000 ng/mL SCRN) POSITIVE (A)    BARBITURATES NEGATIVE    BENZODIAZEPINES NEGATIVE    COCAINE METABOLITES NEGATIVE    MARIJUANA MET (50 ng/mL SCRN) NEGATIVE    METHADONE NEGATIVE    METHAQUALONE NEGATIVE    OPIATES NEGATIVE    PHENCYCLIDINE NEGATIVE    PROPOXYPHENE NEGATIVE    ALCOHOL, ETHYL (U) NEGATIVE       Assessment & Plan:   Problem List Items Addressed This Visit      Cardiovascular and Mediastinum   Hypertension (Chronic)    Excellent control; she is working on weight loss and healthy eating      Relevant Medications   EPINEPHrine 0.3 mg/0.3 mL IJ SOAJ injection     Digestive   GERD (gastroesophageal reflux disease)    Stop zantac; start other H2 blocker; avoid triggers, elevate HOB, avoid eating before bedtime; let me know if not  improving      Relevant Medications   famotidine (PEPCID) 20 MG tablet     Endocrine   Hypothyroidism - Primary    Check TSH, free T4      Relevant Orders   TSH     Musculoskeletal and Integument   Acanthosis nigricans    Check insulin and A1c and glucose (truly fasting)      Relevant Orders   COMPLETE METABOLIC PANEL WITH GFR   Hemoglobin A1c   Insulin, Free (Bioactive)     Other   Vitamin D deficiency    Check level      Relevant Orders   VITAMIN D 25 Hydroxy (Vit-D Deficiency, Fractures)   RBC microcytosis    Check CBC      Relevant Orders   CBC   Morbid obesity (HCC) (Chronic)    Check labs, morbid obesity risk for diabetes, hyperlipidemia, insulin resistance, etc      High serum ferritin    Check ferritin      Relevant Orders   Ferritin   Adult ADHD    I don't think the adderall is causing the acne       Other Visit Diagnoses    Neck pain on right side       with pain up the side of the head, may be pinched nerve C3, other levels (?); will refer to neurologist; s/s of stroke, reasons to call 911   Relevant Orders   Ambulatory referral to Neurology   Hand numbness       may be cervical radiculopathy, but will refer to neuro; call 911 if s/s of stroke   Relevant Orders   Ambulatory referral to Neurology   Screening cholesterol level       check lipids today   Relevant Orders   Lipid panel       Follow up plan: Return in about 3 months (around 02/18/2019) for follow-up visit with Dr. Sanda Klein.  An after-visit summary was printed and given to the patient at [place].  Please see the patient instructions which may contain other information and recommendations beyond what is mentioned above in the assessment and plan.  Meds ordered this encounter  Medications  . famotidine (PEPCID) 20 MG tablet    Sig: Take 1 tablet (20 mg total) by mouth 2 (two) times daily as needed for heartburn or indigestion.    Dispense:  60 tablet    Refill:  5     Orders Placed This Encounter  Procedures  . CBC  . COMPLETE METABOLIC PANEL WITH GFR  . Hemoglobin A1c  . Lipid panel  . TSH  . VITAMIN D 25 Hydroxy (Vit-D Deficiency, Fractures)  . Ferritin  . Insulin, Free (Bioactive)  . Ambulatory referral to Neurology

## 2018-11-20 NOTE — Patient Instructions (Addendum)
Caution: prolonged use of proton pump inhibitors like omeprazole (Prilosec), pantoprazole (Protonix), esomeprazole (Nexium), and others like Dexilant and Aciphex may increase your risk of pneumonia, Clostridium difficile colitis, osteoporosis, anemia and other health complications Try to limit or avoid triggers like coffee, caffeinated beverages, onions, chocolate, spicy foods, peppermint, acidic foods like pizza, spaghetti sauce, and orange juice Lose weight if you are overweight or obese Try elevating the head of your bed by placing a small wedge between your mattress and box springs to keep acid in the stomach at night instead of coming up into your esophagus  Do contact your gynecologist about your acne and see about getting tested for PCOS  Use the new famotidine  We'll get labs today  We'll have you see a neurologist If you think you are having a stroke or TIA, call 911  Call your dermatologist about the antibiotic

## 2018-11-20 NOTE — Assessment & Plan Note (Signed)
Check TSH, free T4

## 2018-11-20 NOTE — Assessment & Plan Note (Signed)
Stop zantac; start other H2 blocker; avoid triggers, elevate HOB, avoid eating before bedtime; let me know if not improving

## 2018-11-20 NOTE — Assessment & Plan Note (Signed)
Excellent control; she is working on weight loss and healthy eating

## 2018-11-20 NOTE — Assessment & Plan Note (Signed)
I don't think the adderall is causing the acne

## 2018-11-20 NOTE — Assessment & Plan Note (Signed)
Check insulin and A1c and glucose (truly fasting)

## 2018-11-20 NOTE — Progress Notes (Signed)
Greetings. It was a pleasure to see you today. Your complete blood count is stable. You have normal numbers of white blood cells and platelets. You have a few extra red blood cells and they are a little small, which suggests this is inherited. The other labs are pending. Peace, Dr. Sanda Klein

## 2018-11-20 NOTE — Assessment & Plan Note (Signed)
Check level 

## 2018-11-20 NOTE — Assessment & Plan Note (Signed)
Check CBC 

## 2018-11-24 DIAGNOSIS — R51 Headache: Secondary | ICD-10-CM | POA: Diagnosis not present

## 2018-11-24 DIAGNOSIS — R292 Abnormal reflex: Secondary | ICD-10-CM | POA: Diagnosis not present

## 2018-11-24 DIAGNOSIS — R519 Headache, unspecified: Secondary | ICD-10-CM | POA: Insufficient documentation

## 2018-11-24 MED ORDER — LEVOTHYROXINE SODIUM 112 MCG PO TABS: 112.0000 ug | ORAL_TABLET | Freq: Every day | ORAL | 3 refills | Status: DC

## 2018-11-24 NOTE — Telephone Encounter (Signed)
Lab Results  Component Value Date   TSH 2.11 11/20/2018

## 2018-11-27 ENCOUNTER — Encounter: Payer: Self-pay | Admitting: Family Medicine

## 2018-11-27 ENCOUNTER — Other Ambulatory Visit: Payer: Self-pay | Admitting: Neurology

## 2018-11-27 DIAGNOSIS — G35 Multiple sclerosis: Secondary | ICD-10-CM

## 2018-11-27 DIAGNOSIS — R292 Abnormal reflex: Secondary | ICD-10-CM

## 2018-12-02 DIAGNOSIS — N920 Excessive and frequent menstruation with regular cycle: Secondary | ICD-10-CM | POA: Diagnosis not present

## 2018-12-03 LAB — HEMOGLOBIN A1C
Hgb A1c MFr Bld: 5.3 % of total Hgb (ref <5.7)
Mean Plasma Glucose: 105 (calc)
eAG (mmol/L): 5.8 (calc)

## 2018-12-03 LAB — FERRITIN: Ferritin: 105 ng/mL (ref 16–154)

## 2018-12-03 LAB — COMPLETE METABOLIC PANEL WITH GFR
AG RATIO: 1.4 (calc) (ref 1.0–2.5)
ALT: 10 U/L (ref 6–29)
AST: 13 U/L (ref 10–30)
Albumin: 4.1 g/dL (ref 3.6–5.1)
Alkaline phosphatase (APISO): 59 U/L (ref 31–125)
BUN: 16 mg/dL (ref 7–25)
CHLORIDE: 103 mmol/L (ref 98–110)
CO2: 28 mmol/L (ref 20–32)
Calcium: 9.4 mg/dL (ref 8.6–10.2)
Creat: 0.73 mg/dL (ref 0.50–1.10)
GFR, Est African American: 126 mL/min/{1.73_m2} (ref >=60)
GFR, Est Non African American: 109 mL/min/{1.73_m2} (ref >=60)
Globulin: 3 g/dL (calc) (ref 1.9–3.7)
Glucose, Bld: 88 mg/dL (ref 65–99)
POTASSIUM: 4.6 mmol/L (ref 3.5–5.3)
Sodium: 139 mmol/L (ref 135–146)
Total Bilirubin: 0.4 mg/dL (ref 0.2–1.2)
Total Protein: 7.1 g/dL (ref 6.1–8.1)

## 2018-12-03 LAB — CBC
HCT: 39.8 % (ref 35.0–45.0)
Hemoglobin: 12.6 g/dL (ref 11.7–15.5)
MCH: 24.2 pg — AB (ref 27.0–33.0)
MCHC: 31.7 g/dL — AB (ref 32.0–36.0)
MCV: 76.5 fL — ABNORMAL LOW (ref 80.0–100.0)
MPV: 9.6 fL (ref 7.5–12.5)
Platelets: 346 10*3/uL (ref 140–400)
RBC: 5.2 10*6/uL — ABNORMAL HIGH (ref 3.80–5.10)
RDW: 13.3 % (ref 11.0–15.0)
WBC: 6.3 10*3/uL (ref 3.8–10.8)

## 2018-12-03 LAB — LIPID PANEL
Cholesterol: 160 mg/dL (ref <200)
HDL: 54 mg/dL (ref >=50)
LDL Cholesterol (Calc): 92 mg/dL (calc)
Non-HDL Cholesterol (Calc): 106 mg/dL (calc) (ref <130)
Total CHOL/HDL Ratio: 3 (calc) (ref <5.0)
Triglycerides: 57 mg/dL (ref <150)

## 2018-12-03 LAB — VITAMIN D 25 HYDROXY (VIT D DEFICIENCY, FRACTURES): Vit D, 25-Hydroxy: 53 ng/mL (ref 30–100)

## 2018-12-03 LAB — TSH: TSH: 2.11 mIU/L

## 2018-12-03 LAB — INSULIN, FREE (BIOACTIVE): Insulin, Free: 4.3 u[IU]/mL (ref 1.5–14.9)

## 2018-12-04 ENCOUNTER — Encounter: Payer: Self-pay | Admitting: Family Medicine

## 2018-12-05 ENCOUNTER — Other Ambulatory Visit: Payer: Self-pay

## 2018-12-05 ENCOUNTER — Ambulatory Visit
Admission: RE | Admit: 2018-12-05 | Discharge: 2018-12-05 | Disposition: A | Discharge status: Home / Self Care | Payer: Managed Care, Other (non HMO) | Source: Ambulatory Visit | Attending: Neurology | Admitting: Neurology

## 2018-12-05 DIAGNOSIS — R51 Headache: Secondary | ICD-10-CM | POA: Diagnosis not present

## 2018-12-05 DIAGNOSIS — R292 Abnormal reflex: Secondary | ICD-10-CM | POA: Insufficient documentation

## 2018-12-05 DIAGNOSIS — G35 Multiple sclerosis: Secondary | ICD-10-CM | POA: Diagnosis present

## 2018-12-05 MED ORDER — GADOBUTROL 1 MMOL/ML IV SOLN
10.0000 mL | Freq: Once | INTRAVENOUS | Status: AC | PRN
  Administered 2018-12-05: 10 mL via INTRAVENOUS

## 2018-12-07 ENCOUNTER — Ambulatory Visit: Payer: Commercial Managed Care - PPO

## 2018-12-10 ENCOUNTER — Encounter: Payer: Self-pay | Admitting: Family Medicine

## 2018-12-21 DIAGNOSIS — E039 Hypothyroidism, unspecified: Secondary | ICD-10-CM | POA: Diagnosis not present

## 2018-12-21 DIAGNOSIS — I10 Essential (primary) hypertension: Secondary | ICD-10-CM | POA: Diagnosis not present

## 2018-12-23 ENCOUNTER — Encounter: Payer: Self-pay | Admitting: Family Medicine

## 2018-12-25 ENCOUNTER — Telehealth: Payer: Self-pay | Admitting: Family Medicine

## 2018-12-25 NOTE — Telephone Encounter (Signed)
So I don't think this is actually something I handle This has never come up, being an unprecedented pandemic I'll ask my office manager if she has any information about what providers in the system are doing for patients who have to stay home with children

## 2018-12-25 NOTE — Telephone Encounter (Signed)
Pt. called to ask if Dr. Sanda Klein will complete the FMLA form related to pt. 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  Pt. stated if Dr. Sanda Klein is willing to fill this out, she can mail it or drop it by the office.  Stated she was unsure if this can be filled out by Dr. Sanda Klein, since it is not related to medical reasons.  Advised will send this to the office for Dr. Sanda Klein to respond.

## 2018-12-28 NOTE — Telephone Encounter (Signed)
Spoke with patient and informed her that Dr. Sanda Klein would not be able to complete FMLA paperwork because the request is not for a medical diagnosis.  Patient verbalized understanding.

## 2019-01-07 ENCOUNTER — Telehealth: Payer: Managed Care, Other (non HMO) | Admitting: Physician Assistant

## 2019-01-07 ENCOUNTER — Other Ambulatory Visit: Payer: Self-pay | Admitting: Family Medicine

## 2019-01-07 DIAGNOSIS — H60501 Unspecified acute noninfective otitis externa, right ear: Secondary | ICD-10-CM

## 2019-01-07 MED ORDER — NEOMYCIN-POLYMYXIN-HC 3.5-10000-1 OT SOLN: OTIC | 0 refills | Status: DC

## 2019-01-07 MED ORDER — AMOXICILLIN-POT CLAVULANATE 875-125 MG PO TABS: 1.0000 | ORAL_TABLET | Freq: Two times a day (BID) | ORAL | 0 refills | Status: DC

## 2019-01-07 NOTE — Progress Notes (Signed)
E Visit for Swimmer's Ear  We are sorry that you are not feeling well. Here is how we plan to help!  Based on what you have shared with me it looks like you have swimmers ear. Swimmer's ear is a redness or swelling, irritation, or infection of your outer ear canal.  These symptoms usually occur within a few days of swimming.  Your ear canal is a tube that goes from the opening of the ear to the eardrum.  When water stays in your ear canal, germs can grow.  This is a painful condition that often happens to children and swimmers of all ages.  It is not contagious and oral antibiotics are not required to treat uncomplicated swimmer's ear.  The usual symptoms include: Itching inside the ear, Redness or a sense of swelling in the ear, Pain when the ear is tugged on when pressure is placed on the ear, Pus draining from the infected ear. and I have prescribed: Neomycin 0.35%, polymyxin B 10,000 units/mL, and hydrocortisone 0,5% otic solution 4 drops in affected ears four times a day for 7 days  Based on what you have told me you may have a bacterial infection. In addition to the ear drops I have prescribed an oral antibiotic: and Augmentin 625mg  one tablet by mouth twice a day for 10 days  In certain cases swimmer's ear may progress to a more serious bacterial infection of the middle or inner ear.  If you have a fever 102 and up and significantly worsening symptoms, this could indicate a more serious infection moving to the middle/inner and needs face to face evaluation in an office by a provider.  Your symptoms should improve over the next 3 days and should resolve in about 7 days.  HOME CARE:   Wash your hands frequently.  Do not place the tip of the bottle on your ear or touch it with your fingers.  You can take Acetominophen 650 mg every 4-6 hours as needed for pain.  If pain is severe or moderate, you can apply a heating pad (set on low) or hot water bottle (wrapped in a towel) to outer ear for 20  minutes.  This will also increase drainage.  Avoid ear plugs  Do not use Q-tips  After showers, help the water run out by tilting your head to one side.  GET HELP RIGHT AWAY IF:   Fever is over 102.2 degrees.  You develop progressive ear pain or hearing loss.  Ear symptoms persist longer than 3 days after treatment.  MAKE SURE YOU:   Understand these instructions.  Will watch your condition.  Will get help right away if you are not doing well or get worse.  TO PREVENT SWIMMER'S EAR:  Use a bathing cap or custom fitted swim molds to keep your ears dry.  Towel off after swimming to dry your ears.  Tilt your head or pull your earlobes to allow the water to escape your ear canal.  If there is still water in your ears, consider using a hairdryer on the lowest setting.  Thank you for choosing an e-visit. Your e-visit answers were reviewed by a board certified advanced clinical practitioner to complete your personal care plan. Depending upon the condition, your plan could have included both over the counter or prescription medications. Please review your pharmacy choice. Be sure that the pharmacy you have chosen is open so that you can pick up your prescription now.  If there is a problem you  may message your provider in [place] to have the prescription routed to another pharmacy. Your safety is important to [place]. If you have drug allergies check your prescription carefully.  For the next 24 hours, you can use MyChart to ask questions about today's visit, request a non-urgent call back, or ask for a work or school excuse from your e-visit provider. You will get an email in the next two days asking about your experience. I hope that your e-visit has been valuable and will speed your recovery.

## 2019-01-08 MED ORDER — AMPHETAMINE-DEXTROAMPHET ER 15 MG PO CP24: 15.0000 mg | ORAL_CAPSULE | ORAL | 0 refills | Status: DC

## 2019-01-08 MED ORDER — HALCINONIDE 0.1 % EX CREA: TOPICAL_CREAM | CUTANEOUS | 0 refills | Status: DC

## 2019-01-08 NOTE — Telephone Encounter (Signed)
Reviewed PMP aware No early fill One Rx for benzo prior to imaging from neurologist No concerns from me Office visit within the last 3 months Okay to refill adderall

## 2019-01-24 ENCOUNTER — Telehealth: Payer: Managed Care, Other (non HMO) | Admitting: Family

## 2019-01-24 DIAGNOSIS — R399 Unspecified symptoms and signs involving the genitourinary system: Secondary | ICD-10-CM | POA: Diagnosis not present

## 2019-01-24 MED ORDER — CEPHALEXIN 500 MG PO CAPS: 500.0000 mg | ORAL_CAPSULE | Freq: Two times a day (BID) | ORAL | 0 refills | Status: DC

## 2019-01-24 NOTE — Progress Notes (Signed)

## 2019-02-18 ENCOUNTER — Ambulatory Visit (INDEPENDENT_AMBULATORY_CARE_PROVIDER_SITE_OTHER): Payer: Managed Care, Other (non HMO) | Admitting: Nurse Practitioner

## 2019-02-18 ENCOUNTER — Other Ambulatory Visit: Payer: Self-pay

## 2019-02-18 VITALS — Ht 63.0 in | Wt 252.0 lb

## 2019-02-18 DIAGNOSIS — E282 Polycystic ovarian syndrome: Secondary | ICD-10-CM | POA: Diagnosis not present

## 2019-02-18 DIAGNOSIS — E039 Hypothyroidism, unspecified: Secondary | ICD-10-CM | POA: Diagnosis not present

## 2019-02-18 DIAGNOSIS — K219 Gastro-esophageal reflux disease without esophagitis: Secondary | ICD-10-CM | POA: Diagnosis not present

## 2019-02-18 DIAGNOSIS — F909 Attention-deficit hyperactivity disorder, unspecified type: Secondary | ICD-10-CM | POA: Diagnosis not present

## 2019-02-18 MED ORDER — METFORMIN HCL ER 500 MG PO TB24: 500.0000 mg | ORAL_TABLET | Freq: Every day | ORAL | 0 refills | Status: DC

## 2019-02-18 NOTE — Progress Notes (Signed)
Virtual Visit via Video Note  I connected with Deborah Munoz on 02/18/19 at  8:20 AM EDT by a video enabled telemedicine application and verified that I am speaking with the correct person using two identifiers.   Staff discussed the limitations of evaluation and management by telemedicine and the availability of in person appointments. The patient expressed understanding and agreed to proceed.  Patient location: home  My location: home office Other people present: none HPI Hypothyroidism, unspecified type rx synthroid 112 mcg daily, no missed doses.  Denies fa Lab Results  Component Value Date   TSH 2.11 11/20/2018   Adult ADHD Adderall 15mg  daily- states when she is not on it she starts a lot of projects but is unable to complete them. States when she was in school she noticed it helped her focus and complete tasks. States she finished school last week and is helping her kids with school from home.   Essential hypertension Patient had hypertension but was taken of medications after bariatric surgery July 2018.  BP Readings from Last 3 Encounters:  11/20/18 122/70  08/18/18 110/72  04/21/18 132/68   Gastroesophageal reflux disease Comes intermittently but not recently had any medication  PCOS Patient noticed severe acne, irregular periods and hair growth. Was treated by GYN with birth control and metformin and had excessive bleeding on ocp and severe diarrhea on metformin- it was not extended release, however she noticed acne improved greatly.     PHQ2/9: Depression screen [facility] 2/9 11/20/2018 08/18/2018 04/14/2018 11/24/2017 11/14/2017  Decreased Interest 0 0 0 0 0  Down, Depressed, Hopeless 0 0 0 0 0  PHQ - 2 Score 0 0 0 0 0  Altered sleeping 0 0 - - -  Tired, decreased energy 1 0 - - -  Change in appetite 0 0 - - -  Feeling bad or failure about yourself  0 0 - - -  Trouble concentrating 0 0 - - -  Moving slowly or fidgety/restless 0 0 - - -  Suicidal thoughts 0 0 - - -   PHQ-9 Score 1 0 - - -  Difficult doing work/chores Not difficult at all Not difficult at all - - -    PHQ reviewed. Negative  Patient Active Problem List   Diagnosis Date Noted  . GERD (gastroesophageal reflux disease) 11/20/2018  . Bariatric surgery status 11/14/2017  . Dysuria 10/18/2016  . Acanthosis nigricans 10/18/2016  . High serum ferritin 10/18/2016  . Preventative health care 10/18/2016  . Anxiety about health 10/18/2016  . Adjustment disorder with anxiety 05/19/2016  . Bursitis/tendonitis, shoulder 03/21/2016  . 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 Daytime somnolence 09/07/2015  . Muscle strain of chest wall 08/16/2015  . Obstructive sleep apnea 07/20/2015  . RBC microcytosis 05/06/2015  . Hypothyroidism 05/04/2015  . Vitamin D deficiency 05/04/2015  . Dermatofibroma 03/29/2015  . Adult ADHD 03/29/2015  . Hypertension 05/08/2012  . Murmur, cardiac 05/08/2012  . Morbid obesity ([place]) 05/08/2012    Past Medical History:  Diagnosis Date  . History of gallstones   . Hypertension   . Hypothyroidism   . Morbid obesity ([place]) 05/08/2012  . Thyroid disease     Past Surgical History:  Procedure Laterality Date  . BARIATRIC SURGERY     july 5   . CHOLECYSTECTOMY    . ESOPHAGOGASTRODUODENOSCOPY (EGD) WITH PROPOFOL N/A 02/04/2017   Procedure: ESOPHAGOGASTRODUODENOSCOPY (EGD) WITH PROPOFOL;  Surgeon: Lucilla Lame, MD;  Location: [facility] ENDOSCOPY;  Service: Endoscopy;  Laterality: N/A;  .  HAND SURGERY      Social History   Tobacco Use  . Smoking status: Never Smoker  . Smokeless tobacco: Never Used  Substance Use Topics  . Alcohol use: Yes    Alcohol/week: 0.0 standard drinks    Comment: occasionally     Current Outpatient Medications:  .  amoxicillin-clavulanate (AUGMENTIN) 875-125 MG tablet, Take 1 tablet by mouth 2 (two) times daily., Disp: 14 tablet, Rfl: 0 .  amphetamine-dextroamphetamine (ADDERALL XR) 15 MG 24 hr capsule, Take 1 capsule by mouth  every morning., Disp: 30 capsule, Rfl: 0 .  calcium citrate-vitamin D (CALCIUM CITRATE CHEWY BITE) 500-500 MG-UNIT chewable tablet, Chew 1 tablet by mouth daily., Disp: , Rfl:  .  cephALEXin (KEFLEX) 500 MG capsule, Take 1 capsule (500 mg total) by mouth 2 (two) times daily., Disp: 14 capsule, Rfl: 0 .  Cholecalciferol (VITAMIN D3) 2000 units capsule, Take 1 capsule (2,000 Units total) by mouth daily., Disp: , Rfl:  .  doxycycline (MONODOX) 100 MG capsule, Take 1 capsule by mouth daily., Disp: , Rfl:  .  EPINEPHrine 0.3 mg/0.3 mL IJ SOAJ injection, Inject 0.3 mg into the muscle as needed for anaphylaxis., Disp: , Rfl:  .  famotidine (PEPCID) 20 MG tablet, Take 1 tablet (20 mg total) by mouth 2 (two) times daily as needed for heartburn or indigestion., Disp: 60 tablet, Rfl: 5 .  Halcinonide 0.1 % CREA, Use very sparingly on rash up to twice a day; too strong for face, groin, underarms, children, Disp: 30 g, Rfl: 0 .  Iron Carbonyl-Vitamin C-FOS 30-10-25 MG CHEW, Chew 18 mg by mouth 3 (three) times daily., Disp: , Rfl:  .  levothyroxine (SYNTHROID, LEVOTHROID) 112 MCG tablet, Take 1 tablet (112 mcg total) by mouth daily., Disp: 90 tablet, Rfl: 3 .  Multiple Vitamin (MULTIVITAMIN) tablet, Take 1 tablet by mouth daily. Reported on 10/20/2015, Disp: , Rfl:  .  neomycin-polymyxin-hydrocortisone (CORTISPORIN) OTIC solution, Apply 4 drops to affected ear 4 times daily x 7 days, Disp: 10 mL, Rfl: 0 .  tretinoin (RETIN-A) 0.025 % cream, Apply 0.929 application topically at bedtime., Disp: , Rfl:   Allergies  Allergen Reactions  . Shrimp [Shellfish Allergy]     ROS   No other specific complaints in a complete review of systems (except as listed in HPI above).  Objective  Vitals:   02/18/19 0825  Weight: 252 lb (114.3 kg)  Height: 5\' 3"  (1.6 m)    Body mass index is 44.64 kg/m.  Nursing Note and Vital Signs reviewed.  Physical Exam  Constitutional: Patient appears well-developed and  well-nourished. No distress.  HENT: Head: Normocephalic and atraumatic. Pulmonary/Chest: Effort normal  Neurological: alert and oriented, speech normal.  Skin: No rash noted. No erythema.  Psychiatric: Patient has a normal mood and affect. behavior is normal. Judgment and thought content normal.    Assessment & Plan  1. Hypothyroidism, unspecified type Continue dose  2. Adult ADHD Does not need during summer, re-start with school.  3. Gastroesophageal reflux disease, esophagitis presence not specified Diet controlled  4. PCOS (polycystic ovarian syndrome) Will trial extended release and switch to 90 day supply if able to tolerate - metFORMIN (GLUCOPHAGE XR) 500 MG 24 hr tablet; Take 1 tablet (500 mg total) by mouth daily with breakfast.  Dispense: 30 tablet; Refill: 0   Follow Up Instructions:    3 month if needing adderall for school 6 months if not I discussed the assessment and treatment plan with the patient. The  patient was provided an opportunity to ask questions and all were answered. The patient agreed with the plan and demonstrated an understanding of the instructions.   The patient was advised to call back or seek an in-person evaluation if the symptoms worsen or if the condition fails to improve as anticipated.  I provided 16 minutes of non-face-to-face time during this encounter.   Fredderick Severance, NP

## 2019-03-11 ENCOUNTER — Encounter: Payer: Self-pay | Admitting: Family Medicine

## 2019-03-11 ENCOUNTER — Other Ambulatory Visit: Payer: Self-pay

## 2019-03-11 ENCOUNTER — Ambulatory Visit (INDEPENDENT_AMBULATORY_CARE_PROVIDER_SITE_OTHER): Payer: Managed Care, Other (non HMO) | Admitting: Family Medicine

## 2019-03-11 VITALS — BP 116/64 | HR 86 | Temp 98.2°F | Resp 16 | Ht 63.0 in | Wt 255.4 lb

## 2019-03-11 DIAGNOSIS — H6983 Other specified disorders of Eustachian tube, bilateral: Secondary | ICD-10-CM | POA: Diagnosis not present

## 2019-03-11 DIAGNOSIS — M545 Low back pain, unspecified: Secondary | ICD-10-CM

## 2019-03-11 DIAGNOSIS — H60543 Acute eczematoid otitis externa, bilateral: Secondary | ICD-10-CM | POA: Diagnosis not present

## 2019-03-11 MED ORDER — FLUTICASONE PROPIONATE 50 MCG/ACT NA SUSP: 2.0000 | Freq: Every day | NASAL | 2 refills | Status: DC

## 2019-03-11 MED ORDER — HYDROCORTISONE-ACETIC ACID 1-2 % OT SOLN: 3.0000 [drp] | Freq: Three times a day (TID) | OTIC | 1 refills | Status: DC

## 2019-03-11 NOTE — Patient Instructions (Signed)
Core Strength Exercises  Core exercises help to build strength in the muscles between your ribs and your hips (abdominal muscles). These muscles help to support your body and keep your spine stable. It is important to maintain strength in your core to prevent injury and pain. Some activities, such as yoga and Pilates, can help to strengthen core muscles. You can also strengthen core muscles with exercises at home. It is important to talk to your health care provider before you start a new exercise routine. What are the benefits of core strength exercises? Core strength exercises can: Reduce back pain. Help to rebuild strength after a back or spine injury. Help to prevent injury during physical activity, especially injuries to the back and knees. How to do core strength exercises Repeat these exercises 10-15 times, or until you are tired. Do exercises exactly as told by your health care provider and adjust them as directed. It is normal to feel mild stretching, pulling, tightness, or discomfort as you do these exercises. If you feel any pain while doing these exercises, stop. If your pain continues or gets worse when doing core exercises, contact your health care provider. You may want to use a padded yoga or exercise mat for strength exercises that are done on the floor. Bridging  Lie on your back on a firm surface with your knees bent and your feet flat on the floor. Raise your hips so that your knees, hips, and shoulders form a straight line together. Keep your abdominal muscles tight. Hold this position for 3-5 seconds. Slowly lower your hips to the starting position. Let your muscles relax completely between repetitions. Single-leg bridge Lie on your back on a firm surface with your knees bent and your feet flat on the floor. Raise your hips so that your knees, hips, and shoulders form a straight line together. Keep your abdominal muscles tight. Lift one foot off the floor, then completely  straighten that leg. Hold this position for 3-5 seconds. Put the straight leg back down in the bent position. Slowly lower your hips to the starting position. Repeat these steps using your other leg. Side bridge Lie on your side with your knees bent. Prop yourself up on the elbow that is near the floor. Using your abdominal muscles and your elbow that is on the floor, raise your body off the floor. Raise your hip so that your shoulder, hip, and foot form a straight line together. Hold this position for 10 seconds. Keep your head and neck raised and away from your shoulder (in their normal, neutral position). Keep your abdominal muscles tight. Slowly lower your hip to the starting position. Repeat and try to hold this position longer, working your way up to 30 seconds. Abdominal crunch Lie on your back on a firm surface. Bend your knees and keep your feet flat on the floor. Cross your arms over your chest. Without bending your neck, tip your chin slightly toward your chest. Tighten your abdominal muscles as you lift your chest just high enough to lift your shoulder blades off of the floor. Do not hold your breath. You can do this with short lifts or long lifts. Slowly return to the starting position. Bird dog Get on your hands and knees, with your legs shoulder-width apart and your arms under your shoulders. Keep your back straight. Tighten your abdominal muscles. Raise one of your legs off the floor and straighten it. Try to keep it parallel to the floor. Slowly lower your leg to  the starting position. Raise one of your arms off the floor and straighten it. Try to keep it parallel to the floor. Slowly lower your arm to the starting position. Repeat with the other arm and leg. If possible, try raising a leg and arm at the same time, on opposite sides of the body. For example, raise your left hand and your right leg. Deborah Munoz on your belly. Prop up your body onto your forearms and your  feet, keeping your legs straight. Your body should make a straight line between your shoulders and feet. Hold this position for 10 seconds while keeping your abdominal muscles tight. Lower your body to the starting position. Repeat and try to hold this position longer, working your way up to 30 seconds. Cross-core strengthening Stand with your feet shoulder-width apart. Hold a ball out in front of you. Keep your arms straight. Tighten your abdominal muscles and slowly rotate at your waist from side to side. Keep your feet flat. Once you are comfortable, try repeating this exercise with a heavier ball. Top core strengthening Stand about 18 inches (46 cm) in front of a wall, with your back to the wall. Keep your feet flat and shoulder-width apart. Tighten your abdominal muscles. Bend your hips and knees. Slowly reach between your legs to touch the wall behind you. Slowly stand back up. Raise your arms over your head and reach behind you. Return to the starting position. General tips Do not do any exercises that cause pain. If you have pain while exercising, talk to your health care provider. Always stretch before and after doing these exercises. This can help prevent injury. Maintain a healthy weight. Ask your health care provider what weight is healthy for you. Contact a health care provider if: You have back pain that gets worse or does not go away. You feel pain while doing core strength exercises. Get help right away if: You have severe pain that does not get better with medicine. Summary Core exercises help to build strength in the muscles between your ribs and your waist. Core muscles help to support your body and keep your spine stable. Some activities, such as yoga and Pilates, can help to strengthen core muscles. Core strength exercises can help back pain and can prevent injury. If you feel any pain while doing core strength exercises, stop. This information is not intended to  replace advice given to you by your health care provider. Make sure you discuss any questions you have with your health care provider. Document Released: 01/29/2017 Document Revised: 01/29/2017 Document Reviewed: 01/29/2017 Elsevier Interactive Patient Education  2019 [employer]. Back Exercises If you have pain in your back, do these exercises 2-3 times each day or as told by your doctor. When the pain goes away, do the exercises once each day, but repeat the steps more times for each exercise (do more repetitions). If you do not have pain in your back, do these exercises once each day or as told by your doctor. Exercises Single Knee to Chest Do these steps 3-5 times in a row for each leg: 1. Lie on your back on a firm bed or the floor with your legs stretched out. 2. Bring one knee to your chest. 3. Hold your knee to your chest by grabbing your knee or thigh. 4. Pull on your knee until you feel a gentle stretch in your lower back. 5. Keep doing the stretch for 10-30 seconds. 6. Slowly let go of your leg and straighten  it. Pelvic Tilt Do these steps 5-10 times in a row: 1. Lie on your back on a firm bed or the floor with your legs stretched out. 2. Bend your knees so they point up to the ceiling. Your feet should be flat on the floor. 3. Tighten your lower belly (abdomen) muscles to press your lower back against the floor. This will make your tailbone point up to the ceiling instead of pointing down to your feet or the floor. 4. Stay in this position for 5-10 seconds while you gently tighten your muscles and breathe evenly. Cat-Cow Do these steps until your lower back bends more easily: 1. Get on your hands and knees on a firm surface. Keep your hands under your shoulders, and keep your knees under your hips. You may put padding under your knees. 2. Let your head hang down, and make your tailbone point down to the floor so your lower back is round like the back of a cat. 3. Stay in this  position for 5 seconds. 4. Slowly lift your head and make your tailbone point up to the ceiling so your back hangs low (sags) like the back of a cow. 5. Stay in this position for 5 seconds.  Press-Ups Do these steps 5-10 times in a row: 1. Lie on your belly (face-down) on the floor. 2. Place your hands near your head, about shoulder-width apart. 3. While you keep your back relaxed and keep your hips on the floor, slowly straighten your arms to raise the top half of your body and lift your shoulders. Do not use your back muscles. To make yourself more comfortable, you may change where you place your hands. 4. Stay in this position for 5 seconds. 5. Slowly return to lying flat on the floor.  Bridges Do these steps 10 times in a row: 1. Lie on your back on a firm surface. 2. Bend your knees so they point up to the ceiling. Your feet should be flat on the floor. 3. Tighten your butt muscles and lift your butt off of the floor until your waist is almost as high as your knees. If you do not feel the muscles working in your butt and the back of your thighs, slide your feet 1-2 inches farther away from your butt. 4. Stay in this position for 3-5 seconds. 5. Slowly lower your butt to the floor, and let your butt muscles relax. If this exercise is too easy, try doing it with your arms crossed over your chest. Belly Crunches Do these steps 5-10 times in a row: 1. Lie on your back on a firm bed or the floor with your legs stretched out. 2. Bend your knees so they point up to the ceiling. Your feet should be flat on the floor. 3. Cross your arms over your chest. 4. Tip your chin a little bit toward your chest but do not bend your neck. 5. Tighten your belly muscles and slowly raise your chest just enough to lift your shoulder blades a tiny bit off of the floor. 6. Slowly lower your chest and your head to the floor. Back Lifts Do these steps 5-10 times in a row: 1. Lie on your belly (face-down) with  your arms at your sides, and rest your forehead on the floor. 2. Tighten the muscles in your legs and your butt. 3. Slowly lift your chest off of the floor while you keep your hips on the floor. Keep the back of your head in  line with the curve in your back. Look at the floor while you do this. 4. Stay in this position for 3-5 seconds. 5. Slowly lower your chest and your face to the floor. Contact a doctor if:  Your back pain gets a lot worse when you do an exercise.  Your back pain does not lessen 2 hours after you exercise. If you have any of these problems, stop doing the exercises. Do not do them again unless your doctor says it is okay. Get help right away if:  You have sudden, very bad back pain. If this happens, stop doing the exercises. Do not do them again unless your doctor says it is okay. This information is not intended to replace advice given to you by your health care provider. Make sure you discuss any questions you have with your health care provider. Document Released: 10/12/2010 Document Revised: 06/03/2018 Document Reviewed: 11/03/2014 Elsevier Interactive Patient Education  2019 [employer] Back Exercises The following exercises strengthen the muscles that help to support the back. They also help to keep the lower back flexible. Doing these exercises can help to prevent back pain or lessen existing pain. If you have back pain or discomfort, try doing these exercises 2-3 times each day or as told by your health care provider. When the pain goes away, do them once each day, but increase the number of times that you repeat the steps for each exercise (do more repetitions). If you do not have back pain or discomfort, do these exercises once each day or as told by your health care provider. Exercises Single Knee to Chest Repeat these steps 3-5 times for each leg: 1. Lie on your back on a firm bed or the floor with your legs extended. 2. Bring one knee to your chest. Your  other leg should stay extended and in contact with the floor. 3. Hold your knee in place by grabbing your knee or thigh. 4. Pull on your knee until you feel a gentle stretch in your lower back. 5. Hold the stretch for 10-30 seconds. 6. Slowly release and straighten your leg. Pelvic Tilt Repeat these steps 5-10 times: 1. Lie on your back on a firm bed or the floor with your legs extended. 2. Bend your knees so they are pointing toward the ceiling and your feet are flat on the floor. 3. Tighten your lower abdominal muscles to press your lower back against the floor. This motion will tilt your pelvis so your tailbone points up toward the ceiling instead of pointing to your feet or the floor. 4. With gentle tension and even breathing, hold this position for 5-10 seconds. Cat-Cow Repeat these steps until your lower back becomes more flexible: 1. Get into a hands-and-knees position on a firm surface. Keep your hands under your shoulders, and keep your knees under your hips. You may place padding under your knees for comfort. 2. Let your head hang down, and point your tailbone toward the floor so your lower back becomes rounded like the back of a cat. 3. Hold this position for 5 seconds. 4. Slowly lift your head and point your tailbone up toward the ceiling so your back forms a sagging arch like the back of a cow. 5. Hold this position for 5 seconds.  Press-Ups Repeat these steps 5-10 times: 1. Lie on your abdomen (face-down) on the floor. 2. Place your palms near your head, about shoulder-width apart. 3. While you keep your back as relaxed as possible and  keep your hips on the floor, slowly straighten your arms to raise the top half of your body and lift your shoulders. Do not use your back muscles to raise your upper torso. You may adjust the placement of your hands to make yourself more comfortable. 4. Hold this position for 5 seconds while you keep your back relaxed. 5. Slowly return to lying  flat on the floor.  Bridges Repeat these steps 10 times: 1. Lie on your back on a firm surface. 2. Bend your knees so they are pointing toward the ceiling and your feet are flat on the floor. 3. Tighten your buttocks muscles and lift your buttocks off of the floor until your waist is at almost the same height as your knees. You should feel the muscles working in your buttocks and the back of your thighs. If you do not feel these muscles, slide your feet 1-2 inches farther away from your buttocks. 4. Hold this position for 3-5 seconds. 5. Slowly lower your hips to the starting position, and allow your buttocks muscles to relax completely. If this exercise is too easy, try doing it with your arms crossed over your chest. Abdominal Crunches Repeat these steps 5-10 times: 1. Lie on your back on a firm bed or the floor with your legs extended. 2. Bend your knees so they are pointing toward the ceiling and your feet are flat on the floor. 3. Cross your arms over your chest. 4. Tip your chin slightly toward your chest without bending your neck. 5. Tighten your abdominal muscles and slowly raise your trunk (torso) high enough to lift your shoulder blades a tiny bit off of the floor. Avoid raising your torso higher than that, because it can put too much stress on your low back and it does not help to strengthen your abdominal muscles. 6. Slowly return to your starting position. Back Lifts Repeat these steps 5-10 times: 1. Lie on your abdomen (face-down) with your arms at your sides, and rest your forehead on the floor. 2. Tighten the muscles in your legs and your buttocks. 3. Slowly lift your chest off of the floor while you keep your hips pressed to the floor. Keep the back of your head in line with the curve in your back. Your eyes should be looking at the floor. 4. Hold this position for 3-5 seconds. 5. Slowly return to your starting position. Contact a health care provider if:  Your back pain  or discomfort gets much worse when you do an exercise.  Your back pain or discomfort does not lessen within 2 hours after you exercise. If you have any of these problems, stop doing these exercises right away. Do not do them again unless your health care provider says that you can. Get help right away if:  You develop sudden, severe back pain. If this happens, stop doing the exercises right away. Do not do them again unless your health care provider says that you can. This information is not intended to replace advice given to you by your health care provider. Make sure you discuss any questions you have with your health care provider. Document Released: 10/17/2004 Document Revised: 01/13/2018 Document Reviewed: 11/03/2014 Elsevier Interactive Patient Education  [employer].

## 2019-03-11 NOTE — Progress Notes (Signed)
Name: Deborah Munoz   MRN: [id number]    DOB: 1986-04-05   Date:03/11/2019       Progress Note  Subjective  Chief Complaint  Chief Complaint  Patient presents with  . Ear Fullness    Onset-1 week and feels like they are getting worst, popping in her ear and discharge.     HPI  Ear problems: symptoms started about one week with fullness bilaterally, and over the past few days she noticed drainage and crusting on left ear, associated with some discomfort. She states right side feels like it is under water and both ears are popping. She had a diagnosis of OM back in April 2020 by telemedicine and was given oral antibiotics and ear drops and symptoms resolved. She never had ear tubes , but is concerned because of symptoms are back now  Low back pain: it has been going on for years, she states recently going to chiropractor, she states pain worse in am's, she sleeps on her stomach, occasionally pain goes down to her buttocks, no bowel or bladder incontinence. Discussed PT at home, strength training for her back   Patient Active Problem List   Diagnosis Date Noted  . Headache disorder 11/24/2018  . GERD (gastroesophageal reflux disease) 11/20/2018  . Acanthosis nigricans 10/18/2016  . High serum ferritin 10/18/2016  . Adjustment disorder with anxiety 05/19/2016  . Bursitis/tendonitis, shoulder 03/21/2016  . 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 Muscle strain of chest wall 08/16/2015  . RBC microcytosis 05/06/2015  . Hypothyroidism 05/04/2015  . Vitamin D deficiency 05/04/2015  . Dermatofibroma 03/29/2015  . Adult ADHD 03/29/2015  . Murmur, cardiac 05/08/2012  . Morbid obesity ([place]) 05/08/2012    Past Surgical History:  Procedure Laterality Date  . BARIATRIC SURGERY     july 5   . CHOLECYSTECTOMY    . ESOPHAGOGASTRODUODENOSCOPY (EGD) WITH PROPOFOL N/A 02/04/2017   Procedure: ESOPHAGOGASTRODUODENOSCOPY (EGD) WITH PROPOFOL;  Surgeon: Lucilla Lame, MD;  Location: [facility] ENDOSCOPY;   Service: Endoscopy;  Laterality: N/A;  . HAND SURGERY      Family History  Problem Relation Age of Onset  . Hypertension Mother   . Diabetes Mother   . Diabetes Father   . Hypertension Father   . Heart disease Maternal Aunt   . Heart disease Maternal Aunt   . Diabetes Maternal Grandmother   . Hypertension Maternal Grandmother   . Cancer Maternal Grandfather        bone  . Diabetes Paternal Grandmother   . Hypertension Paternal Grandmother     Social History   Socioeconomic History  . Marital status: Married    Spouse name: Not on file  . Number of children: 2  . Years of education: Not on file  . Highest education level: Some college, no degree  Occupational History  . Not on file  Social Needs  . Financial resource strain: Not hard at all  . Food insecurity    Worry: Never true    Inability: Never true  . Transportation needs    Medical: No    Non-medical: No  Tobacco Use  . Smoking status: Never Smoker  . Smokeless tobacco: Never Used  Substance and Sexual Activity  . Alcohol use: Yes    Alcohol/week: 0.0 standard drinks    Comment: occasionally  . Drug use: No  . Sexual activity: Yes    Birth control/protection: Surgical    Comment: Pt husband has had vasectomy   Lifestyle  . Physical activity  Days per week: 0 days    Minutes per session: 0 min  . Stress: Not at all  Relationships  . Social Herbalist on phone: Not on file    Gets together: Not on file    Attends religious service: Not on file    Active member of club or organization: Not on file    Attends meetings of clubs or organizations: Not on file    Relationship status: Not on file  . Intimate partner violence    Fear of current or ex partner: Not on file    Emotionally abused: Not on file    Physically abused: Not on file    Forced sexual activity: Not on file  Other Topics Concern  . Not on file  Social History Narrative  . Not on file     Current Outpatient  Medications:  .  amphetamine-dextroamphetamine (ADDERALL XR) 15 MG 24 hr capsule, Take 1 capsule by mouth every morning., Disp: 30 capsule, Rfl: 0 .  EPINEPHrine 0.3 mg/0.3 mL IJ SOAJ injection, Inject 0.3 mg into the muscle as needed for anaphylaxis., Disp: , Rfl:  .  Halcinonide 0.1 % CREA, Use very sparingly on rash up to twice a day; too strong for face, groin, underarms, children, Disp: 30 g, Rfl: 0 .  levothyroxine (SYNTHROID, LEVOTHROID) 112 MCG tablet, Take 1 tablet (112 mcg total) by mouth daily., Disp: 90 tablet, Rfl: 3 .  metFORMIN (GLUCOPHAGE XR) 500 MG 24 hr tablet, Take 1 tablet (500 mg total) by mouth daily with breakfast., Disp: 30 tablet, Rfl: 0 .  Multiple Vitamin (MULTIVITAMIN) tablet, Take 1 tablet by mouth daily. Reported on 10/20/2015, Disp: , Rfl:  .  tretinoin (RETIN-A) 0.025 % cream, Apply 1.962 application topically at bedtime., Disp: , Rfl:  .  acetic acid-hydrocortisone (VOSOL-HC) OTIC solution, Place 3 drops into both ears 3 (three) times daily., Disp: 10 mL, Rfl: 1 .  Cholecalciferol (VITAMIN D3) 2000 units capsule, Take 1 capsule (2,000 Units total) by mouth daily., Disp: , Rfl:  .  fluticasone (FLONASE) 50 MCG/ACT nasal spray, Place 2 sprays into both nostrils daily., Disp: 16 g, Rfl: 2  Allergies  Allergen Reactions  . Shrimp [Shellfish Allergy]     I personally reviewed active problem list, medication list, allergies, family history, social history with the patient/caregiver today.   ROS  Ten systems reviewed and is negative except as mentioned in HPI   Objective  Vitals:   03/11/19 0841  BP: 116/64  Pulse: 86  Resp: 16  Temp: 98.2 F (36.8 C)  TempSrc: Oral  SpO2: 99%  Weight: 255 lb 6.4 oz (115.8 kg)  Height: 5\' 3"  (1.6 m)    Body mass index is 45.24 kg/m.  Physical Exam  Constitutional: Patient appears well-developed and well-nourished. Obese  No distress.  HEENT: head atraumatic, normocephalic, pupils equal and reactive to light, ears  normal TM, flaky debris on ear canal, no redness or swelling of ear canal , neck supple, throat within normal limits Cardiovascular: Normal rate, regular rhythm and normal heart sounds.  No murmur heard. No BLE edema. Pulmonary/Chest: Effort normal and breath sounds normal. No respiratory distress. Abdominal: Soft.  There is no tenderness. Psychiatric: Patient has a normal mood and affect. behavior is normal. Judgment and thought content normal.  PHQ2/9: Depression screen [facility] 2/9 03/11/2019 02/18/2019 11/20/2018 08/18/2018 04/14/2018  Decreased Interest 0 0 0 0 0  Down, Depressed, Hopeless 0 0 0 0 0  PHQ - 2  Score 0 0 0 0 0  Altered sleeping 0 0 0 0 -  Tired, decreased energy 1 0 1 0 -  Change in appetite 0 0 0 0 -  Feeling bad or failure about yourself  0 0 0 0 -  Trouble concentrating 2 0 0 0 -  Moving slowly or fidgety/restless 0 0 0 0 -  Suicidal thoughts 0 0 0 0 -  PHQ-9 Score 3 0 1 0 -  Difficult doing work/chores Somewhat difficult Not difficult at all Not difficult at all Not difficult at all -    phq 9 is negative   Fall Risk: Fall Risk  03/11/2019 02/18/2019 11/20/2018 08/18/2018 04/14/2018  Falls in the past year? 0 0 0 0 No  Number falls in past yr: 0 - 0 - -  Injury with Fall? 0 - 0 - -     Functional Status Survey: Is the patient deaf or have difficulty hearing?: No Does the patient have difficulty seeing, even when wearing glasses/contacts?: No Does the patient have difficulty concentrating, remembering, or making decisions?: No Does the patient have difficulty walking or climbing stairs?: No Does the patient have difficulty dressing or bathing?: No Does the patient have difficulty doing errands alone such as visiting a doctor's office or shopping?: No    Assessment & Plan  1. Acute eczematoid otitis externa of both ears  - acetic acid-hydrocortisone (VOSOL-HC) OTIC solution; Place 3 drops into both ears 3 (three) times daily.  Dispense: 10 mL; Refill: 1  2.  Intermittent low back pain  Discussed home exercising   3. Eustachian tube dysfunction, bilateral  - fluticasone (FLONASE) 50 MCG/ACT nasal spray; Place 2 sprays into both nostrils daily.  Dispense: 16 g; Refill: 2

## 2019-03-13 ENCOUNTER — Other Ambulatory Visit: Payer: Self-pay | Admitting: Nurse Practitioner

## 2019-03-13 DIAGNOSIS — E282 Polycystic ovarian syndrome: Secondary | ICD-10-CM

## 2019-03-17 ENCOUNTER — Encounter: Payer: Self-pay | Admitting: Family Medicine

## 2019-03-17 ENCOUNTER — Other Ambulatory Visit: Payer: Self-pay | Admitting: Family Medicine

## 2019-03-17 DIAGNOSIS — L309 Dermatitis, unspecified: Secondary | ICD-10-CM

## 2019-03-17 MED ORDER — TRIAMCINOLONE ACETONIDE 0.1 % EX CREA: 1.0000 "application " | TOPICAL_CREAM | Freq: Two times a day (BID) | CUTANEOUS | 0 refills | Status: DC

## 2019-03-22 ENCOUNTER — Encounter: Payer: Self-pay | Admitting: Family Medicine

## 2019-04-23 ENCOUNTER — Encounter: Payer: Self-pay | Admitting: Family Medicine

## 2019-05-11 ENCOUNTER — Ambulatory Visit (INDEPENDENT_AMBULATORY_CARE_PROVIDER_SITE_OTHER): Payer: Managed Care, Other (non HMO) | Admitting: Nurse Practitioner

## 2019-05-11 ENCOUNTER — Other Ambulatory Visit: Payer: Self-pay

## 2019-05-11 ENCOUNTER — Encounter: Payer: Self-pay | Admitting: Nurse Practitioner

## 2019-05-11 VITALS — Temp 98.2°F | Ht 63.0 in | Wt 256.9 lb

## 2019-05-11 DIAGNOSIS — K219 Gastro-esophageal reflux disease without esophagitis: Secondary | ICD-10-CM | POA: Diagnosis not present

## 2019-05-11 DIAGNOSIS — E039 Hypothyroidism, unspecified: Secondary | ICD-10-CM | POA: Diagnosis not present

## 2019-05-11 DIAGNOSIS — E559 Vitamin D deficiency, unspecified: Secondary | ICD-10-CM

## 2019-05-11 DIAGNOSIS — Z3041 Encounter for surveillance of contraceptive pills: Secondary | ICD-10-CM

## 2019-05-11 DIAGNOSIS — F909 Attention-deficit hyperactivity disorder, unspecified type: Secondary | ICD-10-CM

## 2019-05-11 DIAGNOSIS — M25572 Pain in left ankle and joints of left foot: Secondary | ICD-10-CM

## 2019-05-11 MED ORDER — MELOXICAM 7.5 MG PO TABS: 7.5000 mg | ORAL_TABLET | Freq: Every day | ORAL | 0 refills | Status: DC

## 2019-05-11 NOTE — Progress Notes (Signed)
Virtual Visit via Video Note  I connected with Deborah Munoz on 05/11/19 at 11:20 AM EDT by a video enabled telemedicine application and verified that I am speaking with the correct person using two identifiers.   Staff discussed the limitations of evaluation and management by telemedicine and the availability of in person appointments. The patient expressed understanding and agreed to proceed.  Patient location: home  My location: work office Other people present: none HPI  Hypothyroidism Patient rx levothryoxine 112 mcg daily. Has been on this dose for few years. Patient denies fatigue/palpitations, insomnia, constipation/diarrhea, hot/cold intolerances,   Lab Results  Component Value Date   TSH 2.11 11/20/2018   GERD Takes nexium PRN Triggers: spicy food  ADHD Was taking adderall but states she has not taken since she has not been in school.   PCOS Was rx metformin at last visit and she started to take it but it caused a lot of GI upset and couldn't work with it.  Wt Readings from Last 3 Encounters:  05/11/19 256 lb 14.4 oz (116.5 kg)  03/11/19 255 lb 6.4 oz (115.8 kg)  02/18/19 252 lb (114.3 kg)    Left ankle pain Went to [place] clinic walk-in on 7/29 for left ankle pain after she had stepped on it wrong. X-ray was completed and noticed incidental bone cyst noted. Has not taken anything for pain presently, has been walking but not doing any strenuous activity. Pain is mild-moderate and is worse after activity. Pain has improved when she initially took naproxen for 10 days.   PHQ2/9: Depression screen [facility] 2/9 05/11/2019 03/11/2019 02/18/2019 11/20/2018 08/18/2018  Decreased Interest 0 0 0 0 0  Down, Depressed, Hopeless 0 0 0 0 0  PHQ - 2 Score 0 0 0 0 0  Altered sleeping 0 0 0 0 0  Tired, decreased energy 0 1 0 1 0  Change in appetite 0 0 0 0 0  Feeling bad or failure about yourself  0 0 0 0 0  Trouble concentrating 0 2 0 0 0  Moving slowly or fidgety/restless 0 0 0 0  0  Suicidal thoughts 0 0 0 0 0  PHQ-9 Score 0 3 0 1 0  Difficult doing work/chores Not difficult at all Somewhat difficult Not difficult at all Not difficult at all Not difficult at all    PHQ reviewed. Negative  Patient Active Problem List   Diagnosis Date Noted  . Headache disorder 11/24/2018  . GERD (gastroesophageal reflux disease) 11/20/2018  . Acanthosis nigricans 10/18/2016  . High serum ferritin 10/18/2016  . Adjustment disorder with anxiety 05/19/2016  . Bursitis/tendonitis, shoulder 03/21/2016  . 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 Muscle strain of chest wall 08/16/2015  . RBC microcytosis 05/06/2015  . Hypothyroidism 05/04/2015  . Vitamin D deficiency 05/04/2015  . Dermatofibroma 03/29/2015  . Adult ADHD 03/29/2015  . Murmur, cardiac 05/08/2012  . Morbid obesity ([place]) 05/08/2012    Past Medical History:  Diagnosis Date  . History of gallstones   . Hypertension   . Hypothyroidism   . Morbid obesity ([place]) 05/08/2012  . Thyroid disease     Past Surgical History:  Procedure Laterality Date  . BARIATRIC SURGERY     july 5   . CHOLECYSTECTOMY    . ESOPHAGOGASTRODUODENOSCOPY (EGD) WITH PROPOFOL N/A 02/04/2017   Procedure: ESOPHAGOGASTRODUODENOSCOPY (EGD) WITH PROPOFOL;  Surgeon: Lucilla Lame, MD;  Location[facility] ENDOSCOPY;  Service: Endoscopy;  Laterality: N/A;  . HAND SURGERY      Social  History   Tobacco Use  . Smoking status: Never Smoker  . Smokeless tobacco: Never Used  Substance Use Topics  . Alcohol use: Yes    Alcohol/week: 0.0 standard drinks    Comment: occasionally     Current Outpatient Medications:  .  acetic acid-hydrocortisone (VOSOL-HC) OTIC solution, Place 3 drops into both ears 3 (three) times daily., Disp: 10 mL, Rfl: 1 .  Cholecalciferol (VITAMIN D3 PO), Take 2 capsules by mouth daily. 800 mg each, Disp: , Rfl:  .  Cholecalciferol (VITAMIN D3) 2000 units capsule, Take 1 capsule (2,000 Units total) by mouth daily., Disp: , Rfl:   .  EPINEPHrine 0.3 mg/0.3 mL IJ SOAJ injection, Inject 0.3 mg into the muscle as needed for anaphylaxis., Disp: , Rfl:  .  fluticasone (FLONASE) 50 MCG/ACT nasal spray, Place 2 sprays into both nostrils daily., Disp: 16 g, Rfl: 2 .  Halcinonide 0.1 % CREA, Use very sparingly on rash up to twice a day; too strong for face, groin, underarms, children, Disp: 30 g, Rfl: 0 .  levothyroxine (SYNTHROID, LEVOTHROID) 112 MCG tablet, Take 1 tablet (112 mcg total) by mouth daily., Disp: 90 tablet, Rfl: 3 .  Multiple Vitamin (MULTIVITAMIN) tablet, Take 1 tablet by mouth daily. Reported on 10/20/2015, Disp: , Rfl:  .  triamcinolone cream (KENALOG) 0.1 %, Apply 1 application topically 2 (two) times daily., Disp: 45 g, Rfl: 0 .  amphetamine-dextroamphetamine (ADDERALL XR) 15 MG 24 hr capsule, Take 1 capsule by mouth every morning. (Patient not taking: Reported on 05/11/2019), Disp: 30 capsule, Rfl: 0 .  metFORMIN (GLUCOPHAGE-XR) 500 MG 24 hr tablet, TAKE 1 TABLET BY MOUTH EVERY DAY WITH BREAKFAST (Patient not taking: Reported on 05/11/2019), Disp: 90 tablet, Rfl: 0  Allergies  Allergen Reactions  . Shrimp [Shellfish Allergy]     ROS   No other specific complaints in a complete review of systems (except as listed in HPI above).  Objective  Vitals:   05/11/19 0924  Temp: 98.2 F (36.8 C)  TempSrc: Oral  Weight: 256 lb 14.4 oz (116.5 kg)  Height: 5\' 3"  (1.6 m)    Body mass index is 45.51 kg/m.  Nursing Note and Vital Signs reviewed.  Physical Exam   Constitutional: Patient appears well-developed and well-nourished. No distress.  Neurological: alert and oriented, speech normal.  Psychiatric: Patient has a normal mood and affect. behavior is normal. Judgment and thought content normal.    Assessment & Plan  1. Gastroesophageal reflux disease, esophagitis presence not specified Stable   2. Hypothyroidism, unspecified type stable  3. Morbid obesity ([place]) Healthy eating   4. Adult  ADHD Not on meds presently   5. Vitamin D deficiency Continuing supplementation   6. Acute left ankle pain PRN  - meloxicam (MOBIC) 7.5 MG tablet; Take 1 tablet (7.5 mg total) by mouth daily.  Dispense: 30 tablet; Refill: 0   Follow Up Instructions:    I discussed the assessment and treatment plan with the patient. The patient was provided an opportunity to ask questions and all were answered. The patient agreed with the plan and demonstrated an understanding of the instructions.   The patient was advised to call back or seek an in-person evaluation if the symptoms worsen or if the condition fails to improve as anticipated.  I provided 17 minutes of non-face-to-face time during this encounter, additional 5 min in review.   Fredderick Severance, NP

## 2019-05-12 ENCOUNTER — Encounter: Payer: Self-pay | Admitting: Family Medicine

## 2019-05-12 MED ORDER — ETHYNODIOL DIAC-ETH ESTRADIOL 1-35 MG-MCG PO TABS: 1.0000 | ORAL_TABLET | Freq: Every day | ORAL | 11 refills | Status: DC

## 2019-05-12 NOTE — Addendum Note (Signed)
Addended by: Fredderick Severance on: 05/12/2019 01:58 PM   Modules accepted: Orders

## 2019-05-18 ENCOUNTER — Other Ambulatory Visit: Payer: Self-pay | Admitting: Family Medicine

## 2019-05-24 ENCOUNTER — Other Ambulatory Visit: Payer: Self-pay | Admitting: Nurse Practitioner

## 2019-05-24 MED ORDER — SAXENDA 18 MG/3ML ~~LOC~~ SOPN: 0.6000 mg | PEN_INJECTOR | Freq: Every day | SUBCUTANEOUS | 3 refills | Status: DC

## 2019-05-24 NOTE — Progress Notes (Signed)
Please schedule one month follow-up for weight management

## 2019-05-25 NOTE — Progress Notes (Signed)
lvm for pt to call and schedule an appt °

## 2019-05-28 ENCOUNTER — Ambulatory Visit (INDEPENDENT_AMBULATORY_CARE_PROVIDER_SITE_OTHER): Payer: Commercial Managed Care - PPO | Admitting: Emergency Medicine

## 2019-05-28 ENCOUNTER — Other Ambulatory Visit: Payer: Self-pay | Admitting: Nurse Practitioner

## 2019-05-28 ENCOUNTER — Other Ambulatory Visit: Payer: Self-pay | Admitting: Family Medicine

## 2019-05-28 ENCOUNTER — Other Ambulatory Visit: Payer: Self-pay

## 2019-05-28 DIAGNOSIS — Z23 Encounter for immunization: Secondary | ICD-10-CM | POA: Diagnosis not present

## 2019-05-28 MED ORDER — SAXENDA 18 MG/3ML ~~LOC~~ SOPN: 0.6000 mg | PEN_INJECTOR | Freq: Every day | SUBCUTANEOUS | 3 refills | Status: DC

## 2019-05-28 NOTE — Progress Notes (Signed)
fluarix

## 2019-06-08 ENCOUNTER — Encounter: Payer: Self-pay | Admitting: Family Medicine

## 2019-06-09 ENCOUNTER — Other Ambulatory Visit (INDEPENDENT_AMBULATORY_CARE_PROVIDER_SITE_OTHER): Payer: Commercial Managed Care - PPO

## 2019-06-09 ENCOUNTER — Other Ambulatory Visit: Payer: Self-pay

## 2019-06-09 DIAGNOSIS — R450 Nervousness: Secondary | ICD-10-CM | POA: Diagnosis not present

## 2019-06-09 DIAGNOSIS — Z5181 Encounter for therapeutic drug level monitoring: Secondary | ICD-10-CM

## 2019-06-09 LAB — GLUCOSE, POCT (MANUAL RESULT ENTRY): POC Glucose: 82 mg/dl (ref 70–99)

## 2019-06-10 LAB — COMPLETE METABOLIC PANEL WITH GFR
AG Ratio: 1.4 (calc) (ref 1.0–2.5)
ALT: 13 U/L (ref 6–29)
AST: 18 U/L (ref 10–30)
Albumin: 3.9 g/dL (ref 3.6–5.1)
Alkaline phosphatase (APISO): 50 U/L (ref 31–125)
BUN/Creatinine Ratio: 18 (calc) (ref 6–22)
BUN: 20 mg/dL (ref 7–25)
CO2: 27 mmol/L (ref 20–32)
Calcium: 8.9 mg/dL (ref 8.6–10.2)
Chloride: 104 mmol/L (ref 98–110)
Creat: 1.14 mg/dL — ABNORMAL HIGH (ref 0.50–1.10)
GFR, Est African American: 73 mL/min/{1.73_m2} (ref >=60)
GFR, Est Non African American: 63 mL/min/{1.73_m2} (ref >=60)
Globulin: 2.7 g/dL (calc) (ref 1.9–3.7)
Glucose, Bld: 78 mg/dL (ref 65–99)
Potassium: 4.3 mmol/L (ref 3.5–5.3)
Sodium: 138 mmol/L (ref 135–146)
Total Bilirubin: 0.3 mg/dL (ref 0.2–1.2)
Total Protein: 6.6 g/dL (ref 6.1–8.1)

## 2019-06-13 ENCOUNTER — Encounter: Payer: Self-pay | Admitting: Family Medicine

## 2019-06-14 ENCOUNTER — Other Ambulatory Visit: Payer: Self-pay | Admitting: Family Medicine

## 2019-06-14 MED ORDER — INSULIN PEN NEEDLE 32G X 6 MM MISC: 2 refills | Status: DC

## 2019-07-21 ENCOUNTER — Other Ambulatory Visit: Payer: Self-pay | Admitting: Family Medicine

## 2019-07-21 DIAGNOSIS — L309 Dermatitis, unspecified: Secondary | ICD-10-CM

## 2019-09-10 ENCOUNTER — Ambulatory Visit: Payer: Managed Care, Other (non HMO) | Admitting: Family Medicine

## 2019-10-19 ENCOUNTER — Encounter: Payer: Self-pay | Admitting: Family Medicine

## 2019-10-20 ENCOUNTER — Ambulatory Visit (INDEPENDENT_AMBULATORY_CARE_PROVIDER_SITE_OTHER): Payer: Commercial Managed Care - PPO | Admitting: Family Medicine

## 2019-10-20 ENCOUNTER — Encounter: Payer: Self-pay | Admitting: Family Medicine

## 2019-10-20 VITALS — Temp 97.9°F | Ht 63.0 in | Wt 250.0 lb

## 2019-10-20 DIAGNOSIS — M25462 Effusion, left knee: Secondary | ICD-10-CM

## 2019-10-20 DIAGNOSIS — T7802XD Anaphylactic reaction due to shellfish (crustaceans), subsequent encounter: Secondary | ICD-10-CM

## 2019-10-20 DIAGNOSIS — M25562 Pain in left knee: Secondary | ICD-10-CM

## 2019-10-20 MED ORDER — EPINEPHRINE 0.3 MG/0.3ML IJ SOAJ: 0.3000 mg | INTRAMUSCULAR | 1 refills | Status: DC | PRN

## 2019-10-20 MED ORDER — MELOXICAM 15 MG PO TABS: 15.0000 mg | ORAL_TABLET | Freq: Every day | ORAL | 0 refills | Status: DC

## 2019-10-20 NOTE — Patient Instructions (Signed)
Recommend RICE treatment - see below, start taking mobic 15 mg once daily for the next 5-7 days, use an ace wrap or knee sleeve (neopreen knee brace or support) avoid strenuous movement or activity.  Follow up if not improving gradually over the next several weeks.  Follow up if worsening - including increased pain, swelling, redness, limited range of motion, feeling of instability.   RICE Therapy for Routine Care of Injuries Many injuries can be cared for with rest, ice, compression, and elevation (RICE therapy). This includes:  Resting the injured part.  Putting ice on the injury.  Putting pressure (compression) on the injury.  Raising the injured part (elevation). Using RICE therapy can help to lessen pain and swelling. Supplies needed:  Ice.  Plastic bag.  Towel.  Elastic bandage.  Pillow or pillows to raise (elevate) your injured body part. How to care for your injury with RICE therapy Rest Limit your normal activities, and try not to use the injured part of your body. You can go back to your normal activities when your doctor says it is okay to do them and you feel okay. Ask your doctor if you should do exercises to help your injury get better. Ice Put ice on the injured area. Do not put ice on your bare skin.  Put ice in a plastic bag.  Place a towel between your skin and the bag.  Leave the ice on for 20 minutes, 2-3 times a day. Use ice on as many days as told by your doctor.  Compression Compression means putting pressure on the injured area. This can be done with an elastic bandage. If an elastic bandage has been put on your injury:  Do not wrap the bandage too tight. Wrap the bandage more loosely if part of your body away from the bandage is blue, swollen, cold, painful, or loses feeling (gets numb).  Take off the bandage and put it on again. Do this every 3-4 hours or as told by your doctor.  See your doctor if the bandage seems to make your problems  worse.  Elevation Elevation means keeping the injured area raised. If you can, raise the injured area above your heart or the center of your chest. Contact a doctor if:  You keep having pain and swelling.  Your symptoms get worse. Get help right away if:  You have sudden bad pain at your injury or lower than your injury.  You have redness or more swelling around your injury.  You have tingling or numbness at your injury or lower than your injury, and it does not go away when you take off the bandage. Summary  Many injuries can be cared for using rest, ice, compression, and elevation (RICE therapy).  You can go back to your normal activities when you feel okay and your doctor says it is okay.  Put ice on the injured area as told by your doctor.  Get help if your symptoms get worse or if you keep having pain and swelling. This information is not intended to replace advice given to you by your health care provider. Make sure you discuss any questions you have with your health care provider. Document Revised: 05/30/2017 Document Reviewed: 05/30/2017 Elsevier Patient Education  [place].

## 2019-10-20 NOTE — Progress Notes (Signed)
Name: Deborah Munoz   MRN: [id number]    DOB: [birth year]   Date:10/20/2019       Progress Note  Subjective:    Chief Complaint  Chief Complaint  Patient presents with  . Edema    in left knee, onset 2 days, no injury or trauma.  Pt states was tight and warm to tuch.  Had meloxicam and took one last night feeling better today, but still slightly there.    I connected with  [name]  on 10/20/19 at  3:00 PM EST by a video enabled telemedicine application and verified that I am speaking with the correct person using two identifiers.  I discussed the limitations of evaluation and management by telemedicine and the availability of in person appointments. The patient expressed understanding and agreed to proceed. Staff also discussed with the patient that there may be a patient responsible charge related to this service. Patient Location: home Provider Location: [facility] Additional Individuals present: none  HPI Patient presents with 2 days of left knee pain with redness heat and feeling of fluid on the outer aspect of her knee to kneecap.  She denies any injury or strain.  She has no history of gout or any inflammatory conditions or arthritis.  She states that felt tight with swollen skin and slightly warm to the touch.  She had prior meloxicam that she tried and it felt better and decreased in swelling and redness over the next day she continues to have some remaining symptoms redness has improved.  Is mildly tender today.   She does not feel any popping or clicking, does not feel any instability is slightly exacerbated with palpation and weightbearing other than Mobic she has not tried anything else to treat and no other aggravating or alleviating factors. All of the pts mom and sisters have severe OA requiring total knee replacements.    Patient denies any other joint pain denies any recent illness, tick bite, lymphadenopathy, fever, chills, sweats.    Patient Active Problem  List   Diagnosis Date Noted  . Headache disorder 11/24/2018  . GERD (gastroesophageal reflux disease) 11/20/2018  . Acanthosis nigricans 10/18/2016  . High serum ferritin 10/18/2016  . Adjustment disorder with anxiety 05/19/2016  . 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 RBC microcytosis 05/06/2015  . Hypothyroidism 05/04/2015  . Vitamin D deficiency 05/04/2015  . Dermatofibroma 03/29/2015  . Adult ADHD 03/29/2015  . Murmur, cardiac 05/08/2012  . Morbid obesity ([place]) 05/08/2012    Social History   Tobacco Use  . Smoking status: Never Smoker  . Smokeless tobacco: Never Used  Substance Use Topics  . Alcohol use: Yes    Alcohol/week: 0.0 standard drinks    Comment: occasionally     Current Outpatient Medications:  .  Cholecalciferol (VITAMIN D3) 2000 units capsule, Take 1 capsule (2,000 Units total) by mouth daily., Disp: , Rfl:  .  EPINEPHrine 0.3 mg/0.3 mL IJ SOAJ injection, Inject 0.3 mg into the muscle as needed for anaphylaxis., Disp: , Rfl:  .  fluticasone (FLONASE) 50 MCG/ACT nasal spray, Place 2 sprays into both nostrils daily., Disp: 16 g, Rfl: 2 .  levothyroxine (SYNTHROID, LEVOTHROID) 112 MCG tablet, Take 1 tablet (112 mcg total) by mouth daily., Disp: 90 tablet, Rfl: 3 .  meloxicam (MOBIC) 7.5 MG tablet, Take 1 tablet (7.5 mg total) by mouth daily., Disp: 30 tablet, Rfl: 0 .  Multiple Vitamin (MULTIVITAMIN) tablet, Take 1 tablet by mouth daily. Reported on 10/20/2015, Disp: ,  Rfl:  .  triamcinolone cream (KENALOG) 0.1 %, APPLY TO AFFECTED AREA TWICE A DAY, Disp: 45 g, Rfl: 0 .  amphetamine-dextroamphetamine (ADDERALL XR) 15 MG 24 hr capsule, Take 1 capsule by mouth every morning. (Patient not taking: Reported on 05/11/2019), Disp: 30 capsule, Rfl: 0 .  ethynodiol-ethinyl estradiol (KELNOR 1/35) 1-35 MG-MCG tablet, Take 1 tablet by mouth daily. (Patient not taking: Reported on 10/20/2019), Disp: 1 Package, Rfl: 11 .  Halcinonide 0.1 % CREA, Use very sparingly on rash up  to twice a day; too strong for face, groin, underarms, children (Patient not taking: Reported on 10/20/2019), Disp: 30 g, Rfl: 0 .  Insulin Pen Needle 32G X 6 MM MISC, For use daily with Saxenda injections, Disp: 90 each, Rfl: 2 .  Liraglutide -Weight Management (SAXENDA) 18 MG/3ML SOPN, Inject 0.6 mg into the skin daily. (Patient not taking: Reported on 10/20/2019), Disp: 1 pen, Rfl: 3  Allergies  Allergen Reactions  . Shrimp [Shellfish Allergy]    I personally reviewed active problem list, medication list, allergies, family history, social history, health maintenance, notes from last encounter, lab results, imaging with the patient/caregiver today.   Review of Systems  10 Systems reviewed and are negative for acute change except as noted in the HPI.   Objective:   Virtual encounter, vitals limited, only able to obtain the following Today's Vitals   10/20/19 1448  Temp: 97.9 F (36.6 C)  Weight: 250 lb (113.4 kg)  Height: 5\' 3"  (1.6 m)   Body mass index is 44.29 kg/m. Nursing Note and Vital Signs reviewed.  Physical Exam Vitals and nursing note reviewed.  Constitutional:      General: She is not in acute distress.    Appearance: Normal appearance. She is well-developed. She is obese. She is not ill-appearing, toxic-appearing or diaphoretic.  HENT:     Head: Normocephalic and atraumatic.  Eyes:     General:        Right eye: No discharge.        Left eye: No discharge.     Conjunctiva/sclera: Conjunctivae normal.  Neck:     Trachea: No tracheal deviation.  Cardiovascular:     Rate and Rhythm: Normal rate.  Pulmonary:     Effort: Pulmonary effort is normal. No respiratory distress.     Breath sounds: No stridor.  Musculoskeletal:     Comments: Patient attempted to hold her phone so that I could visualize anterior portion of her knee, there was no appreciable erythema or effusion, body habitus made assessing edema difficult with only unilateral view  Skin:     Coloration: Skin is not jaundiced or pale.     Findings: No rash.  Neurological:     Mental Status: She is alert.  Psychiatric:        Mood and Affect: Mood normal.        Behavior: Behavior normal.     PE limited by telephone encounter  No results found for this or any previous visit (from the past 72 hour(s)).  Assessment and Plan:     ICD-10-CM   1. Pain and swelling of left knee  M25.562    M25.462    onset yesterday, unclear etiology, improving, OA? meniscal injury? other inflammation/arthritis? conservative tx, RICE and mobic f/up PRN  2. Anaphylactic shock due to shellfish, subsequent encounter  T78.02XD EPINEPHrine 0.3 mg/0.3 mL IJ SOAJ injection   Refill on EpiPen    I did offer the patient x-rays imaging or referral to  a specialist.  She preferred to wait since since onset it has improved.  Encouraged her to get a compression brace or sleeve for her knee elevate ice and take Mobic at 15 mg dose daily.  If not improving in 1 to 2 weeks asked the patient to let me know so I could refer her to a specialist.  -Red flags and when to present for emergency care or RTC including fever >101.70F, chest pain, shortness of breath, new/worsening/un-resolving symptoms, reviewed with patient at time of visit. Follow up and care instructions discussed and provided in AVS. - I discussed the assessment and treatment plan with the patient. The patient was provided an opportunity to ask questions and all were answered. The patient agreed with the plan and demonstrated an understanding of the instructions.  I provided 12 minutes of non-face-to-face time during this encounter.  Delsa Grana, PA-C 10/20/19 3:42 PM

## 2019-10-30 ENCOUNTER — Other Ambulatory Visit: Payer: Self-pay | Admitting: Family Medicine

## 2019-10-30 DIAGNOSIS — L309 Dermatitis, unspecified: Secondary | ICD-10-CM

## 2019-11-01 ENCOUNTER — Encounter: Payer: Self-pay | Admitting: Family Medicine

## 2019-11-01 DIAGNOSIS — L309 Dermatitis, unspecified: Secondary | ICD-10-CM

## 2019-11-01 MED ORDER — TRIAMCINOLONE ACETONIDE 0.1 % EX CREA: TOPICAL_CREAM | CUTANEOUS | 1 refills | Status: DC

## 2019-11-11 ENCOUNTER — Other Ambulatory Visit: Payer: Self-pay | Admitting: Family Medicine

## 2019-11-12 ENCOUNTER — Encounter: Payer: Self-pay | Admitting: Family Medicine

## 2019-12-04 ENCOUNTER — Other Ambulatory Visit: Payer: Self-pay | Admitting: Family Medicine

## 2019-12-14 ENCOUNTER — Other Ambulatory Visit: Payer: Self-pay | Admitting: Family Medicine

## 2019-12-14 DIAGNOSIS — H6983 Other specified disorders of Eustachian tube, bilateral: Secondary | ICD-10-CM

## 2019-12-14 NOTE — Telephone Encounter (Signed)
Requested Prescriptions  Pending Prescriptions Disp Refills  . fluticasone (FLONASE) 50 MCG/ACT nasal spray [Pharmacy Med Name: FLUTICASONE PROP 50 MCG SPRAY] 48 mL     Sig: SPRAY 2 SPRAYS INTO EACH NOSTRIL EVERY DAY     Ear, Nose, and Throat: Nasal Preparations - Corticosteroids Passed - 12/14/2019 11:59 AM      Passed - Valid encounter within last 12 months    Recent Outpatient Visits          1 month ago Pain and swelling of left knee   [facility] Delsa Grana, PA-C   7 months ago Gastroesophageal reflux disease, esophagitis presence not specified   [place], NP   9 months ago Acute eczematoid otitis externa of both ears   [facility] Steele Sizer, MD   9 months ago Hypothyroidism, unspecified type   [place], NP   1 year ago Hypothyroidism, unspecified type   [place], Satira Anis, MD      Future Appointments            In 1 week Delsa Grana, PA-C [facility], [facility]

## 2019-12-21 ENCOUNTER — Ambulatory Visit: Payer: Commercial Managed Care - PPO | Admitting: Family Medicine

## 2020-01-12 ENCOUNTER — Encounter: Payer: Self-pay | Admitting: Family Medicine

## 2020-01-12 ENCOUNTER — Other Ambulatory Visit: Payer: Self-pay

## 2020-01-12 ENCOUNTER — Ambulatory Visit: Payer: Commercial Managed Care - PPO | Admitting: Family Medicine

## 2020-01-12 ENCOUNTER — Other Ambulatory Visit: Payer: Self-pay | Admitting: Family Medicine

## 2020-01-12 VITALS — BP 122/74 | HR 95 | Temp 98.1°F | Resp 14 | Ht 63.0 in | Wt 266.0 lb

## 2020-01-12 DIAGNOSIS — E559 Vitamin D deficiency, unspecified: Secondary | ICD-10-CM

## 2020-01-12 DIAGNOSIS — R718 Other abnormality of red blood cells: Secondary | ICD-10-CM

## 2020-01-12 DIAGNOSIS — E039 Hypothyroidism, unspecified: Secondary | ICD-10-CM | POA: Diagnosis not present

## 2020-01-12 DIAGNOSIS — F909 Attention-deficit hyperactivity disorder, unspecified type: Secondary | ICD-10-CM | POA: Diagnosis not present

## 2020-01-12 DIAGNOSIS — Z5181 Encounter for therapeutic drug level monitoring: Secondary | ICD-10-CM

## 2020-01-12 LAB — CBC WITH DIFFERENTIAL/PLATELET
Absolute Monocytes: 470 cells/uL (ref 200–950)
Basophils Absolute: 49 cells/uL (ref 0–200)
Basophils Relative: 0.8 %
Eosinophils Absolute: 220 cells/uL (ref 15–500)
Eosinophils Relative: 3.6 %
HCT: 39.4 % (ref 35.0–45.0)
Hemoglobin: 12.7 g/dL (ref 11.7–15.5)
Lymphs Abs: 1525 cells/uL (ref 850–3900)
MCH: 25 pg — ABNORMAL LOW (ref 27.0–33.0)
MCHC: 32.2 g/dL (ref 32.0–36.0)
MCV: 77.6 fL — ABNORMAL LOW (ref 80.0–100.0)
MPV: 9 fL (ref 7.5–12.5)
Monocytes Relative: 7.7 %
Neutro Abs: 3837 cells/uL (ref 1500–7800)
Neutrophils Relative %: 62.9 %
Platelets: 345 10*3/uL (ref 140–400)
RBC: 5.08 10*6/uL (ref 3.80–5.10)
RDW: 13.6 % (ref 11.0–15.0)
Total Lymphocyte: 25 %
WBC: 6.1 10*3/uL (ref 3.8–10.8)

## 2020-01-12 LAB — COMPLETE METABOLIC PANEL WITH GFR
AG Ratio: 1.3 (calc) (ref 1.0–2.5)
ALT: 13 U/L (ref 6–29)
AST: 16 U/L (ref 10–30)
Albumin: 4 g/dL (ref 3.6–5.1)
Alkaline phosphatase (APISO): 57 U/L (ref 31–125)
BUN: 16 mg/dL (ref 7–25)
CO2: 26 mmol/L (ref 20–32)
Calcium: 9.2 mg/dL (ref 8.6–10.2)
Chloride: 102 mmol/L (ref 98–110)
Creat: 0.77 mg/dL (ref 0.50–1.10)
GFR, Est African American: 118 mL/min/{1.73_m2} (ref >=60)
GFR, Est Non African American: 101 mL/min/{1.73_m2} (ref >=60)
Globulin: 3.1 g/dL (calc) (ref 1.9–3.7)
Glucose, Bld: 77 mg/dL (ref 65–99)
Potassium: 4.4 mmol/L (ref 3.5–5.3)
Sodium: 137 mmol/L (ref 135–146)
Total Bilirubin: 0.4 mg/dL (ref 0.2–1.2)
Total Protein: 7.1 g/dL (ref 6.1–8.1)

## 2020-01-12 LAB — TSH: TSH: 2.55 mIU/L

## 2020-01-12 MED ORDER — LISDEXAMFETAMINE DIMESYLATE 10 MG PO CAPS: ORAL_CAPSULE | ORAL | 0 refills | Status: DC

## 2020-01-12 NOTE — Progress Notes (Signed)
Name: Deborah Munoz   MRN: [id number]    DOB: 04/04/1986   Date:01/12/2020       Progress Note  Chief Complaint  Patient presents with  . Follow-up  . Medication Refill  . Hypothyroidism  . ADHD    adderall making her break out(acne), may try something new?     Subjective:   Deborah Munoz is a 34 y.o. female, presents to clinic for routine follow up on the conditions listed above.  Hx of PCOS and acne with obesity -  She was worried that ADHD meds that she's been on for years with making her break out but she has been off medications for over a year and she is continuing to have the same concerns with her skin, she has seen dermatology She quit a lot of her meds and tx for PCOS/obesity including metformin saxenda and birth control pills - just to see if anything would improve her skin.  ADHD: She hasn't been on adderall xr 15 mg for the past year - she wanted to see if it helped decrease acne Is interested in trying something different Benefit from med (ie increase in concentration): Yes  - past meds very effective Pt denies CP, rapid HR, dry mouth, weight loss, difficulty sleeping, agitation/mood disturbances. Controlled Substance Database is checked and no suspicious findings.  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 How often do you have trouble wrapping up the final details of a project, once the challenging parts have been done? (!) Often  2. How often do you have difficulty getting things done in order when you have to do a task that requires organization? (!) Often  3. How often do you have problems remembering appointments or obligations? (!) Sometimes  4. When you have a task that requires a lot of thought, how often do you avoid or delay getting started? (!) Very Often  5. How often do you fidget or squirm  with your hands or feet when you have to sit down for a long time? (!) Often  6. How often do you feel overly active and compelled to do things, like you were driven by a motor? Rarely  Part B  7. How often do you make careless mistakes when you have to work on a boring or difficult project? Sometimes  8. How often do you have difficulty keeping your attention when you are doing boring or repetitive work? (!) Often  9. How often do you have difficulty concentrating on what people say to you, even when they are speaking to you directly? (!) Sometimes  10. How often do you misplace or have difficulty finding things at home or at work? (!) Often  11. How often are you distracted by activity or noise around you? (!) Often  12. How often do you leave your seat in meetings or other situations in which you are expected to remain seated? (!) Sometimes  13. How often do you feel restless or fidgety? (!) Often  14. How often do you have difficulty unwinding and relaxing when you have time to yourself? (!) Often  15. How often do you find yourself talking too much when you are in social situations? Sometimes  16. When you are in a conversation, how often do you find yourself finishing  the sentences of the people you are talking to, before they can finish them themselves? (!) Often  17. How often do you have difficulty waiting your turn in situations when turn taking is required? Sometimes  18. How often do you interrupt others when they are busy? (!) Sometimes  Comment  How old were you when these problems first began to occur? 12   Symptoms first occurred in middle school and started having problems with focus.  Hypothyroidism: Pt was on levothyroxine 112 mcg daily, last Rx was over a year ago. She is almost out (although it seems she would have run out already if taking daily Current Symptoms: denies weight changes, heat/cold intolerance, bowel/skin changes or CVS symptoms - some fatigue Most recent  results are below; we will be repeating labs today. Lab Results  Component Value Date   TSH 2.11 11/20/2018        Patient Active Problem List   Diagnosis Date Noted  . Headache disorder 11/24/2018  . GERD (gastroesophageal reflux disease) 11/20/2018  . Acanthosis nigricans 10/18/2016  . High serum ferritin 10/18/2016  . Adjustment disorder with anxiety 05/19/2016  . 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 RBC microcytosis 05/06/2015  . Hypothyroidism 05/04/2015  . Vitamin D deficiency 05/04/2015  . Dermatofibroma 03/29/2015  . Adult ADHD 03/29/2015  . Murmur, cardiac 05/08/2012  . Morbid obesity ([place]) 05/08/2012    Past Surgical History:  Procedure Laterality Date  . BARIATRIC SURGERY     july 5   . CHOLECYSTECTOMY    . ESOPHAGOGASTRODUODENOSCOPY (EGD) WITH PROPOFOL N/A 02/04/2017   Procedure: ESOPHAGOGASTRODUODENOSCOPY (EGD) WITH PROPOFOL;  Surgeon: Lucilla Lame, MD;  Location: [facility] ENDOSCOPY;  Service: Endoscopy;  Laterality: N/A;  . HAND SURGERY      Family History  Problem Relation Age of Onset  . Hypertension Mother   . Diabetes Mother   . Diabetes Father   . Hypertension Father   . Heart disease Maternal Aunt   . Heart disease Maternal Aunt   . Diabetes Maternal Grandmother   . Hypertension Maternal Grandmother   . Cancer Maternal Grandfather        bone  . Diabetes Paternal Grandmother   . Hypertension Paternal Grandmother     Social History   Tobacco Use  . Smoking status: Never Smoker  . Smokeless tobacco: Never Used  Substance Use Topics  . Alcohol use: Yes    Alcohol/week: 0.0 standard drinks    Comment: occasionally  . Drug use: No      Current Outpatient Medications:  .  amphetamine-dextroamphetamine (ADDERALL XR) 15 MG 24 hr capsule, Take 1 capsule by mouth every morning., Disp: 30 capsule, Rfl: 0 .  Cholecalciferol (VITAMIN D3) 2000 units capsule, Take 1 capsule (2,000 Units total) by mouth daily., Disp: , Rfl:  .  EPINEPHrine 0.3  mg/0.3 mL IJ SOAJ injection, Inject 0.3 mLs (0.3 mg total) into the muscle as needed for anaphylaxis (seafood/shelfish allergy)., Disp: 2 each, Rfl: 1 .  fluticasone (FLONASE) 50 MCG/ACT nasal spray, SPRAY 2 SPRAYS INTO EACH NOSTRIL EVERY DAY, Disp: 48 mL, Rfl: 0 .  Halcinonide 0.1 % CREA, Use very sparingly on rash up to twice a day; too strong for face, groin, underarms, children, Disp: 30 g, Rfl: 0 .  levothyroxine (SYNTHROID, LEVOTHROID) 112 MCG tablet, Take 1 tablet (112 mcg total) by mouth daily., Disp: 90 tablet, Rfl: 3 .  meloxicam (MOBIC) 15 MG tablet, TAKE 1 TABLET BY MOUTH EVERY DAY, Disp: 30 tablet, Rfl: 0 .  Multiple Vitamin (MULTIVITAMIN) tablet, Take 1 tablet by mouth daily. Reported on 10/20/2015, Disp: , Rfl:  .  triamcinolone cream (KENALOG) 0.1 %, APPLY TO AFFECTED AREA TWICE A DAY, Disp: 45 g, Rfl: 1 .  ethynodiol-ethinyl estradiol (KELNOR 1/35) 1-35 MG-MCG tablet, Take 1 tablet by mouth daily. (Patient not taking: Reported on 10/20/2019), Disp: 1 Package, Rfl: 11 .  Insulin Pen Needle 32G X 6 MM MISC, For use daily with Saxenda injections, Disp: 90 each, Rfl: 2 .  Liraglutide -Weight Management (SAXENDA) 18 MG/3ML SOPN, Inject 0.6 mg into the skin daily. (Patient not taking: Reported on 10/20/2019), Disp: 1 pen, Rfl: 3  Allergies  Allergen Reactions  . Shrimp [Shellfish Allergy]     Chart Review Today: I personally reviewed active problem list, medication list, allergies, family history, social history, health maintenance, notes from last encounter, lab results, imaging with the patient/caregiver today.   Review of Systems  10 Systems reviewed and are negative for acute change except as noted in the HPI.  Objective:    Vitals:   01/12/20 0913  BP: 122/74  Pulse: 95  Resp: 14  Temp: 98.1 F (36.7 C)  SpO2: 99%  Weight: 266 lb (120.7 kg)  Height: 5\' 3"  (1.6 m)    Body mass index is 47.12 kg/m.  Physical Exam Vitals and nursing note reviewed.  Constitutional:       General: She is not in acute distress.    Appearance: Normal appearance. She is obese. She is not ill-appearing, toxic-appearing or diaphoretic.  HENT:     Head: Normocephalic and atraumatic.  Eyes:     General:        Right eye: No discharge.        Left eye: No discharge.     Conjunctiva/sclera: Conjunctivae normal.  Cardiovascular:     Rate and Rhythm: Normal rate and regular rhythm.     Pulses: Normal pulses.     Heart sounds: Normal heart sounds. No murmur. No friction rub. No gallop.   Pulmonary:     Effort: Pulmonary effort is normal. No respiratory distress.     Breath sounds: No stridor. No wheezing, rhonchi or rales.  Chest:     Chest wall: No tenderness.  Abdominal:     General: Bowel sounds are normal. There is no distension.     Palpations: Abdomen is soft.     Tenderness: There is no abdominal tenderness.  Musculoskeletal:     Right lower leg: No edema.     Left lower leg: No edema.  Skin:    General: Skin is warm and dry.     Coloration: Skin is not jaundiced or pale.  Neurological:     Mental Status: She is alert.     Gait: Gait normal.  Psychiatric:        Mood and Affect: Mood normal.        Behavior: Behavior normal.       PHQ2/9: Depression screen [facility] 2/9 01/12/2020 10/20/2019 05/11/2019 03/11/2019 02/18/2019  Decreased Interest 0 0 0 0 0  Down, Depressed, Hopeless 0 0 0 0 0  PHQ - 2 Score 0 0 0 0 0  Altered sleeping 0 0 0 0 0  Tired, decreased energy 0 0 0 1 0  Change in appetite 0 0 0 0 0  Feeling bad or failure about yourself  0 0 0 0 0  Trouble concentrating 0 0 0 2 0  Moving slowly or fidgety/restless 0 0 0 0 0  Suicidal thoughts 0 0 0 0 0  PHQ-9 Score 0 0 0 3 0  Difficult doing work/chores Not difficult at all Not difficult at all Not difficult at all Somewhat difficult Not difficult at all  Some recent data might be hidden    phq 9 is neg, reviewed today  Fall Risk: Fall Risk  01/12/2020 10/20/2019 05/11/2019 03/11/2019 02/18/2019    Falls in the past year? 0 0 0 0 0  Number falls in past yr: 0 0 0 0 -  Injury with Fall? 0 0 0 0 -    Functional Status Survey: Is the patient deaf or have difficulty hearing?: No Does the patient have difficulty seeing, even when wearing glasses/contacts?: No Does the patient have difficulty concentrating, remembering, or making decisions?: No Does the patient have difficulty walking or climbing stairs?: No Does the patient have difficulty dressing or bathing?: No Does the patient have difficulty doing errands alone such as visiting a doctor's office or shopping?: No   Assessment & Plan:   1. Adult ADHD Patient wishes to try different medication instead of Adderall also trial of Vyvanse at a lower dose, she can increase to 20 mg daily if needed for effectiveness on working days. - lisdexamfetamine (VYVANSE) 10 MG capsule; Start with 10 mg po in the morning before breakfast x 1 week, increase to 20 mg po qam before breakfast  Dispense: 50 capsule; Refill: 0  2. Vitamin D deficiency Encouraged her to resume over-the-counter daily supplements  3. Morbid obesity ([place]) Long history of obesity, weight loss surgery, had been doing weight management and on Saxenda but she stopped this over a year ago she and her family and her 2 boys are all working on healthy diet, decreasing portions, increasing exercise - CBC with Differential/Platelet - COMPLETE METABOLIC PANEL WITH GFR - TSH  4. Hypothyroidism, unspecified type Will check TSH prior to sending in a refill in case there is a dose change needed since labs were over a year ago, patient is not having any concerning symptoms where I would suspect that we will get a need to change the dose very drastically - TSH  5. Microcytosis History of microcytosis recheck labs - CBC with Differential/Platelet  6. Medication monitoring encounter  - CBC with Differential/Platelet - COMPLETE METABOLIC PANEL WITH GFR - TSH   Return for 1 month  f/up ADHD med check (in person).   Delsa Grana, PA-C 01/12/20 9:41 AM

## 2020-02-11 ENCOUNTER — Other Ambulatory Visit: Payer: Self-pay

## 2020-02-11 ENCOUNTER — Encounter: Payer: Self-pay | Admitting: Family Medicine

## 2020-02-11 ENCOUNTER — Ambulatory Visit: Payer: Commercial Managed Care - PPO | Admitting: Family Medicine

## 2020-02-11 VITALS — BP 128/80 | HR 94 | Temp 98.3°F | Resp 16 | Ht 63.0 in | Wt 266.6 lb

## 2020-02-11 DIAGNOSIS — F909 Attention-deficit hyperactivity disorder, unspecified type: Secondary | ICD-10-CM | POA: Diagnosis not present

## 2020-02-11 MED ORDER — LISDEXAMFETAMINE DIMESYLATE 30 MG PO CAPS: 30.0000 mg | ORAL_CAPSULE | Freq: Every day | ORAL | 0 refills | Status: DC

## 2020-02-11 NOTE — Progress Notes (Signed)
Name: Deborah Munoz   MRN: [id number]    DOB: May 20, 1986   Date:02/11/2020       Progress Note  Chief Complaint  Patient presents with  . Follow-up  . ADHD     Subjective:   Deborah Munoz is a 34 y.o. female, presents to clinic for routine follow up on the conditions listed above.  ADHD: Compliant with meds: tried vyvanse 10 mg and increased to 20 mg, did not notice a difference   Pt is taking  Benefit from med (ie increase in concentration): No Pt denies CP, rapid HR, dry mouth, weight loss, difficulty sleeping, agitation/mood disturbances. Controlled Substance Database is checked and no suspicious findings.   She felt a small improvement at work, like she was a little more structured, home is still the same and chaotic.  She was a little more focused and ontask while at work, but continues to be easily distracted.  Adult ADHD Self-Report Scale (ASRS-v1.1) Symptom Checklist  Part A 1. How often do you have trouble wrapping up the final details of a project, once the challenging parts have been done?: (!) Very Often 2. How often do you have difficulty getting things done in order when you have to do a task that requires organization?: (!) Often 3. How often do you have problems remembering appointments or obligations?: (!) Sometimes 4. When you have a task that requires a lot of thought, how often do you avoid or delay getting started?: (!) Often 5. How often do you fidget or squirm with your hands or feet when you have to sit down for a long time?: (!) Very Often 6. How often do you feel overly active and compelled to do things, like you were driven by a motor?: Sometimes  Part B 7. How often do you make careless mistakes when you have to work on a boring or difficult project?: Sometimes 8. How often do you have difficulty keeping your attention when you are doing boring or repetitive work?: (!) Often 9. How often do you have difficulty concentrating on what people say to  you, even when they are speaking to you directly?: (!) Sometimes 10. How often do you misplace or have difficulty finding things at home or at work?: Sometimes 12. How often do you leave your seat in meetings or other situations in which you are expected to remain seated?: (!) Often 13. How often do you feel restless or fidgety?: Sometimes 14. How often do you have difficulty unwinding and relaxing when you have time to yourself?: Rarely 15. How often do you find yourself talking too much when you are in social situations?: Sometimes 16. When you are in a conversation, how often do you find yourself finishing the sentences of the people you are talking to, before they can finish them themselves?: (!) Often 17. How often do you have difficulty waiting your turn in situations when turn taking is required?: Rarely 18. How often do you interrupt others when they are busy?: (!) Sometimes How old were you when these problems first began to occur?: 10  cointrolled substance database reviewed and verified through [facility] today  Patient Active Problem List   Diagnosis Date Noted  . Headache disorder 11/24/2018  . GERD (gastroesophageal reflux disease) 11/20/2018  . Acanthosis nigricans 10/18/2016  . High serum ferritin 10/18/2016  . Adjustment disorder with anxiety 05/19/2016  . 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 RBC microcytosis 05/06/2015  . Hypothyroidism 05/04/2015  . Vitamin D deficiency  05/04/2015  . Dermatofibroma 03/29/2015  . Adult ADHD 03/29/2015  . Murmur, cardiac 05/08/2012  . Morbid obesity ([place]) 05/08/2012    Past Surgical History:  Procedure Laterality Date  . BARIATRIC SURGERY     july 5   . CHOLECYSTECTOMY    . ESOPHAGOGASTRODUODENOSCOPY (EGD) WITH PROPOFOL N/A 02/04/2017   Procedure: ESOPHAGOGASTRODUODENOSCOPY (EGD) WITH PROPOFOL;  Surgeon: Lucilla Lame, MD;  Location: [facility] ENDOSCOPY;  Service: Endoscopy;  Laterality: N/A;  . HAND SURGERY      Family History    Problem Relation Age of Onset  . Hypertension Mother   . Diabetes Mother   . Diabetes Father   . Hypertension Father   . Heart disease Maternal Aunt   . Heart disease Maternal Aunt   . Diabetes Maternal Grandmother   . Hypertension Maternal Grandmother   . Cancer Maternal Grandfather        bone  . Diabetes Paternal Grandmother   . Hypertension Paternal Grandmother     Social History   Tobacco Use  . Smoking status: Never Smoker  . Smokeless tobacco: Never Used  Substance Use Topics  . Alcohol use: Yes    Alcohol/week: 0.0 standard drinks    Comment: occasionally  . Drug use: No      Current Outpatient Medications:  .  Cholecalciferol (VITAMIN D3) 2000 units capsule, Take 1 capsule (2,000 Units total) by mouth daily., Disp: , Rfl:  .  fluticasone (FLONASE) 50 MCG/ACT nasal spray, SPRAY 2 SPRAYS INTO EACH NOSTRIL EVERY DAY, Disp: 48 mL, Rfl: 0 .  Halcinonide 0.1 % CREA, Use very sparingly on rash up to twice a day; too strong for face, groin, underarms, children (Patient taking differently: Use very sparingly on rash up to twice a day; too strong for face, groin, underarms, children  HALOG), Disp: 30 g, Rfl: 0 .  levothyroxine (SYNTHROID) 112 MCG tablet, TAKE 1 TABLET BY MOUTH EVERY DAY, Disp: 90 tablet, Rfl: 3 .  lisdexamfetamine (VYVANSE) 10 MG capsule, Start with 10 mg po in the morning before breakfast x 1 week, increase to 20 mg po qam before breakfast, Disp: 50 capsule, Rfl: 0 .  meloxicam (MOBIC) 15 MG tablet, TAKE 1 TABLET BY MOUTH EVERY DAY, Disp: 30 tablet, Rfl: 0 .  Multiple Vitamin (MULTIVITAMIN) tablet, Take 1 tablet by mouth daily. Reported on 10/20/2015, Disp: , Rfl:  .  triamcinolone cream (KENALOG) 0.1 %, APPLY TO AFFECTED AREA TWICE A DAY, Disp: 45 g, Rfl: 1 .  EPINEPHrine 0.3 mg/0.3 mL IJ SOAJ injection, Inject 0.3 mLs (0.3 mg total) into the muscle as needed for anaphylaxis (seafood/shelfish allergy). (Patient not taking: Reported on 02/11/2020), Disp: 2 each,  Rfl: 1 .  ethynodiol-ethinyl estradiol (KELNOR 1/35) 1-35 MG-MCG tablet, Take 1 tablet by mouth daily. (Patient not taking: Reported on 10/20/2019), Disp: 1 Package, Rfl: 11 .  Insulin Pen Needle 32G X 6 MM MISC, For use daily with Saxenda injections, Disp: 90 each, Rfl: 2  Allergies  Allergen Reactions  . Shrimp [Shellfish Allergy]     Chart Review Today: I personally reviewed active problem list, medication list, allergies, family history, social history, health maintenance, notes from last encounter, lab results, imaging with the patient/caregiver today.   Review of Systems  10 Systems reviewed and are negative for acute change except as noted in the HPI.  Objective:    Vitals:   02/11/20 1416  BP: 128/80  Pulse: 94  Resp: 16  Temp: 98.3 F (36.8 C)  TempSrc: Temporal  SpO2: 98%  Weight: 266 lb 9.6 oz (120.9 kg)  Height: 5\' 3"  (1.6 m)    Body mass index is 47.23 kg/m.  Physical Exam Vitals and nursing note reviewed.  Constitutional:      General: She is not in acute distress.    Appearance: Normal appearance. She is well-developed. She is obese. She is not ill-appearing, toxic-appearing or diaphoretic.  HENT:     Head: Normocephalic and atraumatic.     Nose: Nose normal.     Mouth/Throat:     Mouth: Mucous membranes are moist.     Pharynx: Oropharynx is clear.  Eyes:     General:        Right eye: No discharge.        Left eye: No discharge.     Conjunctiva/sclera: Conjunctivae normal.  Neck:     Trachea: No tracheal deviation.  Cardiovascular:     Rate and Rhythm: Normal rate and regular rhythm.     Pulses: Normal pulses.     Heart sounds: No murmur. No friction rub. No gallop.   Pulmonary:     Effort: Pulmonary effort is normal. No respiratory distress.     Breath sounds: Normal breath sounds. No stridor.  Musculoskeletal:        General: Normal range of motion.  Skin:    General: Skin is warm and dry.     Findings: No rash.  Neurological:      Mental Status: She is alert.     Motor: No abnormal muscle tone.     Coordination: Coordination normal.  Psychiatric:        Mood and Affect: Mood normal.        Behavior: Behavior normal.      PHQ2/9: Depression screen [facility] 2/9 02/11/2020 01/12/2020 10/20/2019 05/11/2019 03/11/2019  Decreased Interest 0 0 0 0 0  Down, Depressed, Hopeless 0 0 0 0 0  PHQ - 2 Score 0 0 0 0 0  Altered sleeping 0 0 0 0 0  Tired, decreased energy 0 0 0 0 1  Change in appetite 0 0 0 0 0  Feeling bad or failure about yourself  0 0 0 0 0  Trouble concentrating 3 0 0 0 2  Moving slowly or fidgety/restless 0 0 0 0 0  Suicidal thoughts 0 0 0 0 0  PHQ-9 Score 3 0 0 0 3  Difficult doing work/chores Not difficult at all Not difficult at all Not difficult at all Not difficult at all Somewhat difficult  Some recent data might be hidden    phq 9 is neg (less than 4), reviewed  Fall Risk: Fall Risk  02/11/2020 01/12/2020 10/20/2019 05/11/2019 03/11/2019  Falls in the past year? 0 0 0 0 0  Number falls in past yr: 0 0 0 0 0  Injury with Fall? 0 0 0 0 0    Functional Status Survey: Is the patient deaf or have difficulty hearing?: No Does the patient have difficulty seeing, even when wearing glasses/contacts?: No Does the patient have difficulty concentrating, remembering, or making decisions?: Yes Does the patient have difficulty walking or climbing stairs?: No Does the patient have difficulty dressing or bathing?: No Does the patient have difficulty doing errands alone such as visiting a doctor's office or shopping?: No   Assessment & Plan:   1. Adult ADHD - lisdexamfetamine (VYVANSE) 30 MG capsule; Take 1 capsule (30 mg total) by mouth daily.  Dispense: 30 capsule; Refill: 0 Trying higher dose, f/up  in a month   Would go back to adderall XR 15 mg if vyvanse doesn't seem to work for her  Return for 1 month virtual f/up.   Delsa Grana, PA-C 02/11/20 2:33 PM

## 2020-02-24 ENCOUNTER — Encounter: Payer: Self-pay | Admitting: Family Medicine

## 2020-02-28 ENCOUNTER — Encounter: Payer: Self-pay | Admitting: Family Medicine

## 2020-02-28 MED ORDER — LORAZEPAM 1 MG PO TABS: 1.0000 mg | ORAL_TABLET | Freq: Once | ORAL | 0 refills | Status: DC | PRN

## 2020-03-06 ENCOUNTER — Other Ambulatory Visit: Payer: Self-pay | Admitting: Family Medicine

## 2020-03-06 DIAGNOSIS — H6983 Other specified disorders of Eustachian tube, bilateral: Secondary | ICD-10-CM

## 2020-03-17 ENCOUNTER — Telehealth: Payer: Commercial Managed Care - PPO | Admitting: Family Medicine

## 2020-03-23 ENCOUNTER — Other Ambulatory Visit: Payer: Self-pay | Admitting: Family Medicine

## 2020-03-23 DIAGNOSIS — L309 Dermatitis, unspecified: Secondary | ICD-10-CM

## 2020-04-16 ENCOUNTER — Other Ambulatory Visit: Payer: Self-pay | Admitting: Family Medicine

## 2020-04-18 ENCOUNTER — Encounter: Payer: Self-pay | Admitting: Family Medicine

## 2020-04-21 NOTE — Telephone Encounter (Signed)
Needs appt to address

## 2020-04-25 ENCOUNTER — Encounter: Payer: Self-pay | Admitting: Family Medicine

## 2020-04-25 ENCOUNTER — Ambulatory Visit: Payer: Commercial Managed Care - PPO | Admitting: Family Medicine

## 2020-04-25 ENCOUNTER — Other Ambulatory Visit: Payer: Self-pay

## 2020-04-25 VITALS — BP 124/80 | HR 90 | Temp 97.9°F | Resp 16 | Ht 63.0 in | Wt 274.5 lb

## 2020-04-25 DIAGNOSIS — R6889 Other general symptoms and signs: Secondary | ICD-10-CM

## 2020-04-25 DIAGNOSIS — R5383 Other fatigue: Secondary | ICD-10-CM

## 2020-04-25 DIAGNOSIS — E039 Hypothyroidism, unspecified: Secondary | ICD-10-CM

## 2020-04-25 DIAGNOSIS — R519 Headache, unspecified: Secondary | ICD-10-CM | POA: Diagnosis not present

## 2020-04-25 LAB — CBC WITH DIFFERENTIAL/PLATELET
Absolute Monocytes: 684 cells/uL (ref 200–950)
Basophils Absolute: 79 cells/uL (ref 0–200)
Basophils Relative: 1.1 %
Eosinophils Absolute: 302 cells/uL (ref 15–500)
Eosinophils Relative: 4.2 %
HCT: 39.7 % (ref 35.0–45.0)
Hemoglobin: 12.6 g/dL (ref 11.7–15.5)
Lymphs Abs: 1656 cells/uL (ref 850–3900)
MCH: 24.6 pg — ABNORMAL LOW (ref 27.0–33.0)
MCHC: 31.7 g/dL — ABNORMAL LOW (ref 32.0–36.0)
MCV: 77.4 fL — ABNORMAL LOW (ref 80.0–100.0)
MPV: 9.4 fL (ref 7.5–12.5)
Monocytes Relative: 9.5 %
Neutro Abs: 4478 cells/uL (ref 1500–7800)
Neutrophils Relative %: 62.2 %
Platelets: 339 10*3/uL (ref 140–400)
RBC: 5.13 10*6/uL — ABNORMAL HIGH (ref 3.80–5.10)
RDW: 13.6 % (ref 11.0–15.0)
Total Lymphocyte: 23 %
WBC: 7.2 10*3/uL (ref 3.8–10.8)

## 2020-04-25 LAB — COMPLETE METABOLIC PANEL WITH GFR
AG Ratio: 1.3 (calc) (ref 1.0–2.5)
ALT: 10 U/L (ref 6–29)
AST: 13 U/L (ref 10–30)
Albumin: 4 g/dL (ref 3.6–5.1)
Alkaline phosphatase (APISO): 56 U/L (ref 31–125)
BUN: 12 mg/dL (ref 7–25)
CO2: 27 mmol/L (ref 20–32)
Calcium: 8.7 mg/dL (ref 8.6–10.2)
Chloride: 101 mmol/L (ref 98–110)
Creat: 0.77 mg/dL (ref 0.50–1.10)
GFR, Est African American: 117 mL/min/{1.73_m2} (ref >=60)
GFR, Est Non African American: 101 mL/min/{1.73_m2} (ref >=60)
Globulin: 3.1 g/dL (calc) (ref 1.9–3.7)
Glucose, Bld: 78 mg/dL (ref 65–99)
Potassium: 4.3 mmol/L (ref 3.5–5.3)
Sodium: 137 mmol/L (ref 135–146)
Total Bilirubin: 0.3 mg/dL (ref 0.2–1.2)
Total Protein: 7.1 g/dL (ref 6.1–8.1)

## 2020-04-25 LAB — T4, FREE: Free T4: 1.3 ng/dL (ref 0.8–1.8)

## 2020-04-25 LAB — TSH: TSH: 2.89 mIU/L

## 2020-04-25 NOTE — Patient Instructions (Signed)
I would call your neurologist and get a follow up if you continue to have pain/headaches     Fatigue If you have fatigue, you feel tired all the time and have a lack of energy or a lack of motivation. Fatigue may make it difficult to start or complete tasks because of exhaustion. In general, occasional or mild fatigue is often a normal response to activity or life. However, long-lasting (chronic) or extreme fatigue may be a symptom of a medical condition. Follow these instructions at home: General instructions  Watch your fatigue for any changes.  Go to bed and get up at the same time every day.  Avoid fatigue by pacing yourself during the day and getting enough sleep at night.  Maintain a healthy weight. Medicines  Take over-the-counter and prescription medicines only as told by your health care provider.  Take a multivitamin, if told by your health care provider.  Do not use herbal or dietary supplements unless they are approved by your health care provider. Activity   Exercise regularly, as told by your health care provider.  Use or practice techniques to help you relax, such as yoga, tai chi, meditation, or massage therapy. Eating and drinking   Avoid heavy meals in the evening.  Eat a well-balanced diet, which includes lean proteins, whole grains, plenty of fruits and vegetables, and low-fat dairy products.  Avoid consuming too much caffeine.  Avoid the use of alcohol.  Drink enough fluid to keep your urine pale yellow. Lifestyle  Change situations that cause you stress. Try to keep your work and personal schedule in balance.  Do not use any products that contain nicotine or tobacco, such as cigarettes and e-cigarettes. If you need help quitting, ask your health care provider.  Do not use drugs. Contact a health care provider if:  Your fatigue does not get better.  You have a fever.  You suddenly lose or gain weight.  You have headaches.  You have  trouble falling asleep or sleeping through the night.  You feel angry, guilty, anxious, or sad.  You are unable to have a bowel movement (constipation).  Your skin is dry.  You have swelling in your legs or another part of your body. Get help right away if:  You feel confused.  Your vision is blurry.  You feel faint or you pass out.  You have a severe headache.  You have severe pain in your abdomen, your back, or the area between your waist and hips (pelvis).  You have chest pain, shortness of breath, or an irregular or fast heartbeat.  You are unable to urinate, or you urinate less than normal.  You have abnormal bleeding, such as bleeding from the rectum, vagina, nose, lungs, or nipples.  You vomit blood.  You have thoughts about hurting yourself or others. If you ever feel like you may hurt yourself or others, or have thoughts about taking your own life, get help right away. You can go to your nearest emergency department or call:  Your local emergency services (911 in the [place]  A suicide crisis helpline, such as the [place] at 250-639-1485. This is open 24 hours a day. Summary  If you have fatigue, you feel tired all the time and have a lack of energy or a lack of motivation.  Fatigue may make it difficult to start or complete tasks because of exhaustion.  Long-lasting (chronic) or extreme fatigue may be a symptom of a medical condition.  Exercise regularly, as told by your health care provider.  Change situations that cause you stress. Try to keep your work and personal schedule in balance. This information is not intended to replace advice given to you by your health care provider. Make sure you discuss any questions you have with your health care provider. Document Revised: 03/31/2019 Document Reviewed: 06/04/2017 Elsevier Patient Education  2020 [employer].

## 2020-04-25 NOTE — Progress Notes (Signed)
Patient ID: Deborah Munoz, female    DOB: [birth year], 34 y.o.   MRN: [id number]  PCP: Delsa Grana, PA-C  Chief Complaint  Patient presents with  . Hypothyroidism    follow up, abnormal symptoms    Subjective:   Deborah Munoz is a 34 y.o. female, presents to clinic with CC of the following:  HPI  Really tired, the last couple weeks, doesn't know what else it can be. Doesn't feel like herself Specifically the last couple of days she's achy, kinda dizzy    Fall asleep at home if she sits on the couch for a little bit In the morning she wakes up and feels like she hasn't slept at all  Last night went to bed at 7 pm to 7 am  Her husband doesn't hear her snoring or having apnea  She would like her thyroid rechecked again It was normal when recently checked - 01/12/2020 TSH was 2.55 - she would like more labs done today than done previously      Patient Active Problem List   Diagnosis Date Noted  . Headache disorder 11/24/2018  . GERD (gastroesophageal reflux disease) 11/20/2018  . Acanthosis nigricans 10/18/2016  . High serum ferritin 10/18/2016  . Adjustment disorder with anxiety 05/19/2016  . 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 RBC microcytosis 05/06/2015  . Hypothyroidism 05/04/2015  . Vitamin D deficiency 05/04/2015  . Dermatofibroma 03/29/2015  . Adult ADHD 03/29/2015  . Murmur, cardiac 05/08/2012  . Morbid obesity ([place]) 05/08/2012      Current Outpatient Medications:  .  Cholecalciferol (VITAMIN D3) 2000 units capsule, Take 1 capsule (2,000 Units total) by mouth daily., Disp: , Rfl:  .  fluticasone (FLONASE) 50 MCG/ACT nasal spray, SPRAY 2 SPRAYS INTO EACH NOSTRIL EVERY DAY, Disp: 48 mL, Rfl: 0 .  Halcinonide 0.1 % CREA, Use very sparingly on rash up to twice a day; too strong for face, groin, underarms, children (Patient taking differently: Use very sparingly on rash up to twice a day; too strong for face, groin, underarms, children  HALOG), Disp:  30 g, Rfl: 0 .  levothyroxine (SYNTHROID) 112 MCG tablet, TAKE 1 TABLET BY MOUTH EVERY DAY, Disp: 90 tablet, Rfl: 3 .  meloxicam (MOBIC) 15 MG tablet, TAKE 1 TABLET BY MOUTH EVERY DAY, Disp: 30 tablet, Rfl: 0 .  Multiple Vitamin (MULTIVITAMIN) tablet, Take 1 tablet by mouth daily. Reported on 10/20/2015, Disp: , Rfl:  .  triamcinolone cream (KENALOG) 0.1 %, APPLY TO AFFECTED AREA TWICE A DAY, Disp: 45 g, Rfl: 1 .  EPINEPHrine 0.3 mg/0.3 mL IJ SOAJ injection, Inject 0.3 mLs (0.3 mg total) into the muscle as needed for anaphylaxis (seafood/shelfish allergy). (Patient not taking: Reported on 02/11/2020), Disp: 2 each, Rfl: 1 .  ethynodiol-ethinyl estradiol (KELNOR 1/35) 1-35 MG-MCG tablet, Take 1 tablet by mouth daily. (Patient not taking: Reported on 10/20/2019), Disp: 1 Package, Rfl: 11 .  Insulin Pen Needle 32G X 6 MM MISC, For use daily with Saxenda injections, Disp: 90 each, Rfl: 2 .  lisdexamfetamine (VYVANSE) 30 MG capsule, Take 1 capsule (30 mg total) by mouth daily. (Patient not taking: Reported on 04/25/2020), Disp: 30 capsule, Rfl: 0 .  LORazepam (ATIVAN) 1 MG tablet, Take 1-2 tablets (1-2 mg total) by mouth once as needed for up to 1 dose for anxiety (for anxiety sedondary to dental procedures). (Patient not taking: Reported on 04/25/2020), Disp: 2 tablet, Rfl: 0   Allergies  Allergen Reactions  . Shrimp [employer]  Allergy]      Social History   Tobacco Use  . Smoking status: Never Smoker  . Smokeless tobacco: Never Used  Vaping Use  . Vaping Use: Never used  Substance Use Topics  . Alcohol use: Yes    Alcohol/week: 0.0 standard drinks    Comment: occasionally  . Drug use: No      Chart Review Today: I personally reviewed active problem list, medication list, allergies, family history, social history, health maintenance, notes from last encounter, lab results, imaging with the patient/caregiver today.   Review of Systems 10 Systems reviewed and are negative for acute change  except as noted in the HPI.     Objective:   Vitals:   04/25/20 1348  Pulse: 90  Resp: 16  Temp: 97.9 F (36.6 C)  TempSrc: Temporal  SpO2: 99%  Weight: 274 lb 8 oz (124.5 kg)  Height: 5\' 3"  (1.6 m)    Body mass index is 48.63 kg/m.  Physical Exam   Results for orders placed or performed in visit on 01/12/20  CBC with Differential/Platelet  Result Value Ref Range   WBC 6.1 3.8 - 10.8 Thousand/uL   RBC 5.08 3.80 - 5.10 Million/uL   Hemoglobin 12.7 11.7 - 15.5 g/dL   HCT 39.4 35 - 45 %   MCV 77.6 (L) 80.0 - 100.0 fL   MCH 25.0 (L) 27.0 - 33.0 pg   MCHC 32.2 32.0 - 36.0 g/dL   RDW 13.6 11.0 - 15.0 %   Platelets 345 140 - 400 Thousand/uL   MPV 9.0 7.5 - 12.5 fL   Neutro Abs 3,837 1,500 - 7,800 cells/uL   Lymphs Abs 1,525 850 - 3,900 cells/uL   Absolute Monocytes 470 200 - 950 cells/uL   Eosinophils Absolute 220 15 - 500 cells/uL   Basophils Absolute 49 0 - 200 cells/uL   Neutrophils Relative % 62.9 %   Total Lymphocyte 25.0 %   Monocytes Relative 7.7 %   Eosinophils Relative 3.6 %   Basophils Relative 0.8 %  COMPLETE METABOLIC PANEL WITH GFR  Result Value Ref Range   Glucose, Bld 77 65 - 99 mg/dL   BUN 16 7 - 25 mg/dL   Creat 0.77 0.50 - 1.10 mg/dL   GFR, Est Non African American 101 > OR = 60 mL/min/1.62m2   GFR, Est African American 118 > OR = 60 mL/min/1.4m2   BUN/Creatinine Ratio NOT APPLICABLE 6 - 22 (calc)   Sodium 137 135 - 146 mmol/L   Potassium 4.4 3.5 - 5.3 mmol/L   Chloride 102 98 - 110 mmol/L   CO2 26 20 - 32 mmol/L   Calcium 9.2 8.6 - 10.2 mg/dL   Total Protein 7.1 6.1 - 8.1 g/dL   Albumin 4.0 3.6 - 5.1 g/dL   Globulin 3.1 1.9 - 3.7 g/dL (calc)   AG Ratio 1.3 1.0 - 2.5 (calc)   Total Bilirubin 0.4 0.2 - 1.2 mg/dL   Alkaline phosphatase (APISO) 57 31 - 125 U/L   AST 16 10 - 30 U/L   ALT 13 6 - 29 U/L  TSH  Result Value Ref Range   TSH 2.55 mIU/L       Assessment & Plan:     ICD-10-CM   1. Hypothyroidism, unspecified type  E03.9 CBC  with Differential/Platelet    COMPLETE METABOLIC PANEL WITH GFR    T4, free    TSH    CANCELED: TSH    CANCELED: T4, free   labs recently  normal, on 125 mcg daily, generalized sx and fatigue - recheck labs  2. 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 Nonintractable episodic headache, unspecified headache type  R51.9    per specialists - they started to get worse when pt restarted ADHD meds, but off meds she is still having more frequent HA's encouraged neuro f/up  4. Cold intolerance  R68.89 CBC with Differential/Platelet    T4, free    TSH   r/o anemia, recheck thyroid - may be normal variant - no pallor today, good cap refill, pt warm   I did discuss with the pt regarding the erratic absorption of levothyroxine and also some of the subjective nature of lab and symptom interpretation and management-if her TSH is still within normal range, may be able to increase her dose slightly and see if her symptoms improve.  I did explain to her that this would require rechecking her TSH 6 to 8 weeks after dose change to ensure that TSH is not abnormally low  Her multiple generalized vague symptoms may also be result of other things going on in her life including some depression or increased stress or increased work demands encouraged her to work on doing other things in her life that help with worklife balance, distressing etc. Patient currently has no cardiac symptoms, no signs of bleeding or blood loss, her mood overall is good, PHQ score was reviewed today was negative does note feeling little bit tired and score of 1 in regards to sleep, could refer for sleep apnea study if labs are unremarkable and patient has no improvement with any med changes.  F/up pending labs and med  changes  for HA's I would think pt would be a good candidate for abortive migraine meds - since seeing specialists for it currently - deferred to them - encouraged pt to f/up  - but I am willing to help if she needs it   Delsa Grana, PA-C 04/25/20 1:50 PM

## 2020-04-26 ENCOUNTER — Other Ambulatory Visit: Payer: Self-pay | Admitting: Family Medicine

## 2020-04-26 MED ORDER — LEVOTHYROXINE SODIUM 125 MCG PO TABS
125.0000 ug | ORAL_TABLET | Freq: Every day | ORAL | 0 refills | Status: DC
Start: 2020-04-26 — End: 2022-04-22

## 2020-04-26 MED ORDER — LEVOTHYROXINE SODIUM 137 MCG PO TABS: 137.0000 ug | ORAL_TABLET | Freq: Every day | ORAL | 3 refills | Status: DC

## 2020-05-02 ENCOUNTER — Encounter: Payer: Self-pay | Admitting: Family Medicine

## 2020-05-18 ENCOUNTER — Other Ambulatory Visit: Payer: Self-pay | Admitting: Family Medicine

## 2020-07-11 ENCOUNTER — Other Ambulatory Visit: Payer: Self-pay | Admitting: Family Medicine

## 2020-07-11 DIAGNOSIS — L309 Dermatitis, unspecified: Secondary | ICD-10-CM

## 2020-08-22 ENCOUNTER — Encounter: Payer: Self-pay | Admitting: Family Medicine

## 2021-02-01 ENCOUNTER — Other Ambulatory Visit: Payer: Self-pay | Admitting: Family Medicine

## 2021-02-01 DIAGNOSIS — L309 Dermatitis, unspecified: Secondary | ICD-10-CM

## 2021-08-27 DIAGNOSIS — M2041 Other hammer toe(s) (acquired), right foot: Secondary | ICD-10-CM | POA: Diagnosis not present

## 2021-08-27 DIAGNOSIS — M898X9 Other specified disorders of bone, unspecified site: Secondary | ICD-10-CM | POA: Diagnosis not present

## 2021-08-27 DIAGNOSIS — G5791 Unspecified mononeuropathy of right lower limb: Secondary | ICD-10-CM | POA: Diagnosis not present

## 2021-08-27 DIAGNOSIS — G5792 Unspecified mononeuropathy of left lower limb: Secondary | ICD-10-CM | POA: Diagnosis not present

## 2021-10-11 DIAGNOSIS — E039 Hypothyroidism, unspecified: Secondary | ICD-10-CM | POA: Diagnosis not present

## 2021-10-11 DIAGNOSIS — K219 Gastro-esophageal reflux disease without esophagitis: Secondary | ICD-10-CM | POA: Diagnosis not present

## 2021-10-15 DIAGNOSIS — J069 Acute upper respiratory infection, unspecified: Secondary | ICD-10-CM | POA: Diagnosis not present

## 2021-10-15 DIAGNOSIS — R0982 Postnasal drip: Secondary | ICD-10-CM | POA: Diagnosis not present

## 2021-10-21 DIAGNOSIS — S56912A Strain of unspecified muscles, fascia and tendons at forearm level, left arm, initial encounter: Secondary | ICD-10-CM | POA: Diagnosis not present

## 2021-12-05 DIAGNOSIS — I10 Essential (primary) hypertension: Secondary | ICD-10-CM | POA: Diagnosis not present

## 2021-12-05 DIAGNOSIS — Z8249 Family history of ischemic heart disease and other diseases of the circulatory system: Secondary | ICD-10-CM | POA: Diagnosis not present

## 2021-12-05 DIAGNOSIS — Z6841 Body Mass Index (BMI) 40.0 and over, adult: Secondary | ICD-10-CM | POA: Diagnosis not present

## 2022-01-14 DIAGNOSIS — M7632 Iliotibial band syndrome, left leg: Secondary | ICD-10-CM | POA: Diagnosis not present

## 2022-01-14 DIAGNOSIS — G8929 Other chronic pain: Secondary | ICD-10-CM | POA: Diagnosis not present

## 2022-01-14 DIAGNOSIS — M25562 Pain in left knee: Secondary | ICD-10-CM | POA: Diagnosis not present

## 2022-03-27 ENCOUNTER — Ambulatory Visit: Payer: Commercial Managed Care - PPO | Admitting: Dermatology

## 2022-04-08 ENCOUNTER — Ambulatory Visit: Payer: Commercial Managed Care - PPO | Admitting: Dermatology

## 2022-04-15 DIAGNOSIS — Z713 Dietary counseling and surveillance: Secondary | ICD-10-CM | POA: Diagnosis not present

## 2022-04-15 DIAGNOSIS — K219 Gastro-esophageal reflux disease without esophagitis: Secondary | ICD-10-CM | POA: Diagnosis not present

## 2022-04-15 DIAGNOSIS — E039 Hypothyroidism, unspecified: Secondary | ICD-10-CM | POA: Diagnosis not present

## 2022-04-15 DIAGNOSIS — Z0000 Encounter for general adult medical examination without abnormal findings: Secondary | ICD-10-CM | POA: Diagnosis not present

## 2022-04-22 ENCOUNTER — Ambulatory Visit: Payer: Medicaid Other | Admitting: Dermatology

## 2022-04-22 DIAGNOSIS — L209 Atopic dermatitis, unspecified: Secondary | ICD-10-CM | POA: Diagnosis not present

## 2022-04-22 DIAGNOSIS — L304 Erythema intertrigo: Secondary | ICD-10-CM

## 2022-04-22 MED ORDER — KETOCONAZOLE 2 % EX CREA: TOPICAL_CREAM | CUTANEOUS | 3 refills | Status: DC

## 2022-04-22 MED ORDER — HALCINONIDE 0.1 % EX CREA: TOPICAL_CREAM | CUTANEOUS | 2 refills | Status: DC

## 2022-04-22 MED ORDER — ELIDEL 1 % EX CREA: TOPICAL_CREAM | CUTANEOUS | 3 refills | Status: DC

## 2022-04-22 NOTE — Progress Notes (Signed)
New Patient Visit  Subjective  Deborah Munoz is a 36 y.o. female who presents for the following: Eczema (Hands, popliteal, back. She uses 1% hydrocortisone mixed with Eucerin cream to treat hands. 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 In the past she has used TMC/Eucerin/HC Cream, but it no longer helps. She uses [employer] and Jergens lotion.). She has tried moisturizers made for eczema, but they make her itchy. She had asthma as a child, but has improved over the years. She uses Halog Cream to rash on back in skin fold. Also gets rash in inguinal creases.   The following portions of the chart were reviewed this encounter and updated as appropriate:       Review of Systems:  No other skin or systemic complaints except as noted in HPI or Assessment and Plan.  Objective  Well appearing patient in no apparent distress; mood and affect are within normal limits.  A focused examination was performed including face, arms, hands, legs. Relevant physical exam findings are noted in the Assessment and Plan.  hands Erythema and scale of the web spaces on hands and dorsal fingers; hyperpigmented scaly patches of the bilateral popliteal fossa  mid back, inguinal creases Erythema of the bilateral mid lower back in body fold, also inguinal folds     Assessment & Plan  Atopic dermatitis, unspecified type hands  Chronic and persistent condition with duration or expected duration over one year. Condition is symptomatic / bothersome to patient. Not to goal.  Atopic dermatitis (eczema) is a chronic, relapsing, pruritic condition that can significantly affect quality of life. It is often associated with allergic rhinitis and/or asthma and can require treatment with topical medications, phototherapy, or in severe cases biologic injectable medication (Dupixent; Adbry) or Oral JAK inhibitors. Hand Dermatitis is a chronic type of eczema that can come and go on the hands and fingers.  While there is  no cure, the rash and symptoms can be managed with topical prescription medications, and for more severe cases, with systemic medications.  Recommend mild soap and routine use of moisturizing cream after handwashing.  Minimize soap/water exposure when possible.     Consider Patch Testing to r/o Allergic Contact Dermatitis. May consider on follow-up if not improving with topicals. Recommend VaniCream products - info given.   Start Elidel Cream Apply to AA eczema QD/BID until improved Start Halog Cream Apply to AA eczema QD/BID until improved. Avoid face, groin, axilla.   Erythema intertrigo mid back, inguinal creases  Chronic and persistent condition with duration or expected duration over one year. Condition is bothersome/symptomatic for patient. Currently flared.   Intertrigo is a chronic recurrent rash that occurs in skin fold areas that may be associated with friction; heat; moisture; yeast; fungus; and bacteria.  It is exacerbated by increased movement / activity; sweating; and higher atmospheric temperature.  Start ketoconazole 2% cream Apply to skin folds QD/BID until improved Start Elidel Cream Apply to skin folds QD/BID until improved Start Zeasorb AF Powder Apply to skin fold areas once a day.   ketoconazole (NIZORAL) 2 % cream - mid back, inguinal creases Apply to skin folds once to twice daily until improved.  ELIDEL 1 % cream - mid back, inguinal creases Apply to affected areas eczema and skin folds once to twice daily until rash improved.   Return in about 2 months (around 06/22/2022) for Atopic Dermatitis.  IJamesetta Orleans, CMA, am acting as scribe for Brendolyn Patty, MD .  Documentation: I have reviewed the above documentation for accuracy and completeness, and I agree with the above.  Brendolyn Patty MD

## 2022-04-22 NOTE — Patient Instructions (Addendum)
Start Zeasorb AF Powder Apply to skin fold areas once daily. Start ketoconazole 2% cream Apply once to twice daily in skin fold areas until improved. Start Elidel Cream Apply to eczema and skin folds once to twice daily until improved. Continue Halog Cream Apply to affected areas eczema once to twice daily until improved. Avoid face, groin, underarms, skin folds.   Dry Skin Care  What causes dry skin?  Dry skin is common and results from inadequate moisture in the outer skin layers. Dry skin usually results from the excessive loss of moisture from the skin surface. This occurs due to two major factors: Normally the skin's oil glands deposit a layer of oil on the skin's surface. This layer of oil prevents the loss of moisture from the skin. Exposure to soaps, cleaners, solvents, and disinfectants removes this oily film, allowing water to escape. Water loss from the skin increases when the humidity is low. During winter months we spend a lot of time indoors where the air is heated. Heated air has very low humidity. This also contributes to dry skin.  A tendency for dry skin may accompany such disorders as eczema. Also, as people age, the number of functioning oil glands decreases, and the tendency toward dry skin can be a sensation of skin tightness when emerging from the shower.  How do I manage dry skin?  Humidify your environment. This can be accomplished by using a humidifier in your bedroom at night during winter months. Bathing can actually put moisture back into your skin if done right. Take the following steps while bathing to sooth dry skin: Avoid hot water, which only dries the skin and makes itching worse. Use warm water. Avoid washcloths or extensive rubbing or scrubbing. Use mild soaps like unscented Dove, Oil of Olay, Cetaphil, Basis, or CeraVe. If you take baths rather than showers, rinse off soap residue with clean water before getting out of tub. Once out of the shower/tub, pat  dry gently with a soft towel. Leave your skin damp. While still damp, apply any medicated ointment/cream you were prescribed to the affected areas. After you apply your medicated ointment/cream, then apply your moisturizer to your whole body.This is the most important step in dry skin care. If this is omitted, your skin will continue to be dry. The choice of moisturizer is also very important. In general, lotion will not provider enough moisture to severely dry skin because it is water based. You should use an ointment or cream. Moisturizers should also be unscented. Good choices include Vaseline (plain petrolatum), Aquaphor, Cetaphil, CeraVe, Vanicream, DML Forte, Aveeno moisture, or Eucerin Cream. Bath oils can be helpful, but do not replace the application of moisturizer after the bath. In addition, they make the tub slippery causing an increased risk for falls. Therefore, we do not recommend their use. Due to recent changes in healthcare laws, you may see results of your pathology and/or laboratory studies on MyChart before the doctors have had a chance to review them. We understand that in some cases there may be results that are confusing or concerning to you. Please understand that not all results are received at the same time and often the doctors may need to interpret multiple results in order to provide you with the best plan of care or course of treatment. Therefore, we ask that you please give [place] 2 business days to thoroughly review all your results before contacting the office for clarification. Should we see a critical lab result, you will  be contacted sooner.   If You Need Anything After Your Visit  If you have any questions or concerns for your doctor, please call our main line at [phone] and press option 4 to reach your doctor's medical assistant. If no one answers, please leave a voicemail as directed and we will return your call as soon as possible. Messages left after 4 pm will be  answered the following business day.   You may also send [place] a message via [place]. We typically respond to MyChart messages within 1-2 business days.  For prescription refills, please ask your pharmacy to contact our office. Our fax number is [phone].  If you have an urgent issue when the clinic is closed that cannot wait until the next business day, you can page your doctor at the number below.    Please note that while we do our best to be available for urgent issues outside of office hours, we are not available 24/7.   If you have an urgent issue and are unable to reach [place], you may choose to seek medical care at your doctor's office, retail clinic, urgent care center, or emergency room.  If you have a medical emergency, please immediately call 911 or go to the emergency department.  Pager Numbers  - Dr. Nehemiah Massed: [phone]  - Dr. Laurence Ferrari: [phone]  - Dr. Nicole Kindred: [phone]  In the event of inclement weather, please call our main line at [phone] for an update on the status of any delays or closures.  Dermatology Medication Tips: Please keep the boxes that topical medications come in in order to help keep track of the instructions about where and how to use these. Pharmacies typically print the medication instructions only on the boxes and not directly on the medication tubes.   If your medication is too expensive, please contact our office at [phone] option 4 or send [place] a message through [place].   We are unable to tell what your co-pay for medications will be in advance as this is different depending on your insurance coverage. However, we may be able to find a substitute medication at lower cost or fill out paperwork to get insurance to cover a needed medication.   If a prior authorization is required to get your medication covered by your insurance company, please allow [place] 1-2 business days to complete this process.  Drug prices often vary depending on  where the prescription is filled and some pharmacies may offer cheaper prices.  The website www.goodrx.com contains coupons for medications through different pharmacies. The prices here do not account for what the cost may be with help from insurance (it may be cheaper with your insurance), but the website can give you the price if you did not use any insurance.  - You can print the associated coupon and take it with your prescription to the pharmacy.  - You may also stop by our office during regular business hours and pick up a GoodRx coupon card.  - If you need your prescription sent electronically to a different pharmacy, notify our office through [facility] or by phone at [phone] option 4.     Si Usted Necesita Algo Despus de Su Visita  Tambin puede enviarnos un mensaje a travs de Pharmacist, community. Por lo general respondemos a los mensajes de MyChart en el transcurso de 1 a 2 das hbiles.  Para renovar recetas, por favor pida a su farmacia que se ponga en contacto con nuestra oficina. Harland Dingwall de fax  es el [phone].  Si tiene un asunto urgente cuando la clnica est cerrada y que no puede esperar hasta el siguiente da hbil, puede llamar/localizar a su doctor(a) al nmero que aparece a continuacin.   Por favor, tenga en cuenta que aunque hacemos todo lo posible para estar disponibles para asuntos urgentes fuera del horario de [place], no estamos disponibles las 24 horas del da, los 7 das de la [place].   Si tiene un problema urgente y no puede comunicarse con nosotros, puede optar por buscar atencin mdica  en el consultorio de su doctor(a), en una clnica privada, en un centro de atencin urgente o en una sala de emergencias.  Si tiene Engineering geologist, por favor llame inmediatamente al 911 o vaya a la sala de emergencias.  Nmeros de bper  - Dr. Nehemiah Massed: [phone]  - Dra. Moye: [phone]  - Dra. Nicole Kindred: [phone]  En caso de inclemencias  del [place], por favor llame a Johnsie Kindred principal al [phone] para una actualizacin sobre el [place] de cualquier retraso o cierre.  Consejos para la medicacin en dermatologa: Por favor, guarde las cajas en las que vienen los medicamentos de uso tpico para ayudarle a seguir las instrucciones sobre dnde y cmo usarlos. Las farmacias generalmente imprimen las instrucciones del medicamento slo en las cajas y no directamente en los tubos del [place].   Si su medicamento es muy caro, por favor, pngase en contacto con Zigmund Daniel llamando al [phone] y presione la opcin 4 o envenos un mensaje a travs de Pharmacist, community.   No podemos decirle cul ser su copago por los medicamentos por adelantado ya que esto es diferente dependiendo de la cobertura de su seguro. Sin embargo, es posible que podamos encontrar un medicamento sustituto a Electrical engineer un formulario para que el seguro cubra el medicamento que se considera necesario.   Si se requiere una autorizacin previa para que su compaa de seguros [place] su medicamento, por favor permtanos de 1 a 2 das hbiles para completar este proceso.  Los precios de los medicamentos varan con frecuencia dependiendo del Environmental consultant de dnde se surte la receta y alguna farmacias pueden ofrecer precios ms baratos.  El sitio web www.goodrx.com tiene cupones para medicamentos de Airline pilot. Los precios aqu no tienen en cuenta lo que podra costar con la ayuda del seguro (puede ser ms barato con su seguro), pero el sitio web puede darle el precio si no utiliz Research scientist (physical sciences).  - Puede imprimir el cupn correspondiente y llevarlo con su receta a la farmacia.  - Tambin puede pasar por nuestra oficina durante el horario de atencin regular y Charity fundraiser una tarjeta de cupones de GoodRx.  - Si necesita que su receta se enve electrnicamente a una farmacia diferente, informe a nuestra oficina a travs de MyChart de [place] o por telfono  llamando al [phone] y presione la opcin 4.

## 2022-05-13 ENCOUNTER — Other Ambulatory Visit: Payer: Self-pay

## 2022-05-13 ENCOUNTER — Emergency Department
Admission: EM | Admit: 2022-05-13 | Discharge: 2022-05-13 | Disposition: A | Discharge status: Home / Self Care | Payer: Medicaid Other | Attending: Emergency Medicine | Admitting: Emergency Medicine

## 2022-05-13 ENCOUNTER — Emergency Department: Payer: Medicaid Other

## 2022-05-13 DIAGNOSIS — R079 Chest pain, unspecified: Secondary | ICD-10-CM | POA: Diagnosis present

## 2022-05-13 DIAGNOSIS — R0602 Shortness of breath: Secondary | ICD-10-CM | POA: Diagnosis not present

## 2022-05-13 DIAGNOSIS — G5732 Lesion of lateral popliteal nerve, left lower limb: Secondary | ICD-10-CM | POA: Diagnosis not present

## 2022-05-13 DIAGNOSIS — I10 Essential (primary) hypertension: Secondary | ICD-10-CM | POA: Insufficient documentation

## 2022-05-13 DIAGNOSIS — D649 Anemia, unspecified: Secondary | ICD-10-CM | POA: Diagnosis not present

## 2022-05-13 DIAGNOSIS — M7632 Iliotibial band syndrome, left leg: Secondary | ICD-10-CM | POA: Diagnosis not present

## 2022-05-13 DIAGNOSIS — G8929 Other chronic pain: Secondary | ICD-10-CM | POA: Diagnosis not present

## 2022-05-13 DIAGNOSIS — R002 Palpitations: Secondary | ICD-10-CM | POA: Diagnosis not present

## 2022-05-13 DIAGNOSIS — R0789 Other chest pain: Secondary | ICD-10-CM | POA: Diagnosis not present

## 2022-05-13 DIAGNOSIS — M25562 Pain in left knee: Secondary | ICD-10-CM | POA: Diagnosis not present

## 2022-05-13 LAB — CBC WITH DIFFERENTIAL/PLATELET
Abs Immature Granulocytes: 0.01 10*3/uL (ref 0.00–0.07)
Basophils Absolute: 0.1 10*3/uL (ref 0.0–0.1)
Basophils Relative: 1 %
Eosinophils Absolute: 0.3 10*3/uL (ref 0.0–0.5)
Eosinophils Relative: 3 %
HCT: 38 % (ref 36.0–46.0)
Hemoglobin: 11.7 g/dL — ABNORMAL LOW (ref 12.0–15.0)
Immature Granulocytes: 0 %
Lymphocytes Relative: 18 %
Lymphs Abs: 1.6 10*3/uL (ref 0.7–4.0)
MCH: 23.4 pg — ABNORMAL LOW (ref 26.0–34.0)
MCHC: 30.8 g/dL (ref 30.0–36.0)
MCV: 76 fL — ABNORMAL LOW (ref 80.0–100.0)
Monocytes Absolute: 0.9 10*3/uL (ref 0.1–1.0)
Monocytes Relative: 10 %
Neutro Abs: 6.4 10*3/uL (ref 1.7–7.7)
Neutrophils Relative %: 68 %
Platelets: 333 10*3/uL (ref 150–400)
RBC: 5 MIL/uL (ref 3.87–5.11)
RDW: 14.8 % (ref 11.5–15.5)
WBC: 9.3 10*3/uL (ref 4.0–10.5)
nRBC: 0 % (ref 0.0–0.2)

## 2022-05-13 LAB — COMPREHENSIVE METABOLIC PANEL
ALT: 12 U/L (ref 0–44)
AST: 19 U/L (ref 15–41)
Albumin: 3.9 g/dL (ref 3.5–5.0)
Alkaline Phosphatase: 44 U/L (ref 38–126)
Anion gap: 8 (ref 5–15)
BUN: 20 mg/dL (ref 6–20)
CO2: 24 mmol/L (ref 22–32)
Calcium: 8.6 mg/dL — ABNORMAL LOW (ref 8.9–10.3)
Chloride: 105 mmol/L (ref 98–111)
Creatinine, Ser: 0.78 mg/dL (ref 0.44–1.00)
GFR, Estimated: >60 mL/min (ref >60)
Glucose, Bld: 94 mg/dL (ref 70–99)
Potassium: 3.4 mmol/L — ABNORMAL LOW (ref 3.5–5.1)
Sodium: 137 mmol/L (ref 135–145)
Total Bilirubin: 0.4 mg/dL (ref 0.3–1.2)
Total Protein: 7.3 g/dL (ref 6.5–8.1)

## 2022-05-13 LAB — TROPONIN I (HIGH SENSITIVITY): Troponin I (High Sensitivity): <2 ng/L (ref <18)

## 2022-05-13 NOTE — ED Triage Notes (Signed)
Pt to ED via POV from home. Pt reports she started feeling palpitations, SOB, cloudy headed that started around 1pm. Pt reports symptoms have subsided. Pt sees Dr. Clayborn Bigness due to family hx but denies cardiac hx of herself. Pt denies blood thinners.   Pt states has appointment with cardiology tomorrow at [place].

## 2022-05-13 NOTE — ED Provider Notes (Signed)
[facility] Provider Note    Event Date/Time   First MD Initiated Contact with Patient 05/13/22 1737     (approximate)   History   Chief Complaint Shortness of Breath and Chest Pain   HPI Deborah Munoz is a 36 y.o. female, history of morbid obesity, ADHD, GERD, hypertension, gallstones, thyroid disease, presents emergency department for evaluation of shortness of breath/palpitations that started at approximately 1 PM today.  She states it lasted a brief period and resolved on its own, but she said she felt very lethargic afterwards.  She scheduled a cardiology appointment for tomorrow, however wonders if she could be evaluated today.  She is currently asymptomatic at this time.  Denies fever/chills, leg cramps, rash/lesions, dizziness/lightheadedness, numbness or tingling upper or lower extremities, headache, cough/congestion, or flank pain.  History Limitations: No limitations.        Physical Exam  Triage Vital Signs: ED Triage Vitals  Enc Vitals Group     BP 05/13/22 1535 (!) 157/105     Pulse Rate 05/13/22 1535 85     Resp 05/13/22 1535 18     Temp 05/13/22 1610 98 F (36.7 C)     Temp Source 05/13/22 1610 Oral     SpO2 05/13/22 1535 100 %     Weight --      Height --      Head Circumference --      Peak Flow --      Pain Score 05/13/22 1535 0     Pain Loc --      Pain Edu? --      Excl. in [place]? --     Most recent vital signs: Vitals:   05/13/22 1535 05/13/22 1610  BP: (!) 157/105   Pulse: 85   Resp: 18   Temp:  98 F (36.7 C)  SpO2: 100%     General: Awake, NAD.  Skin: Warm, dry. No rashes or lesions.  Eyes: PERRL. Conjunctivae normal.  CV: Good peripheral perfusion.  S1-S2 present.  No murmurs, rubs, or gallops. Resp: Normal effort.  Lung sounds are clear bilaterally in the apices and bases. Abd: Soft, non-tender. No distention.  Neuro: At baseline. No gross neurological deficits.   Focused Exam: No chest wall  tenderness.  Physical Exam    ED Results / Procedures / Treatments  Labs (all labs ordered are listed, but only abnormal results are displayed) Labs Reviewed  COMPREHENSIVE METABOLIC PANEL - Abnormal; Notable for the following components:      Result Value   Potassium 3.4 (*)    Calcium 8.6 (*)    All other components within normal limits  CBC WITH DIFFERENTIAL/PLATELET - Abnormal; Notable for the following components:   Hemoglobin 11.7 (*)    MCV 76.0 (*)    MCH 23.4 (*)    All other components within normal limits  URINALYSIS, ROUTINE W REFLEX MICROSCOPIC  POC URINE PREG, ED  TROPONIN I (HIGH SENSITIVITY)  TROPONIN I (HIGH SENSITIVITY)     EKG Sinus rhythm, rate of 85, no ST segment changes, no axis deviations, no AV blocks, normal QRS interval, no QT prolongation.   RADIOLOGY  ED Provider Interpretation: I personally viewed and interpreted this x-ray, no evidence of acute cardiopulmonary process.  DG Chest 2 View  Result Date: 05/13/2022 CLINICAL DATA:  Palpitations with shortness of breath. EXAM: CHEST - 2 VIEW COMPARISON:  Radiographs 02/05/2017. FINDINGS: The heart size and mediastinal contours are normal. The lungs are clear.  There is no pleural effusion or pneumothorax. No acute osseous findings are identified. IMPRESSION: Stable chest.  No active cardiopulmonary process. Electronically Signed   By: Richardean Sale M.D.   On: 05/13/2022 16:23    PROCEDURES:  Critical Care performed: N/A.  Procedures    MEDICATIONS ORDERED IN ED: Medications - No data to display   IMPRESSION / MDM / [place] / ED COURSE  I reviewed the triage vital signs and the nursing notes.                              Differential diagnosis includes, but is not limited to, ACS, myocarditis, pericarditis, PE, [place], pneumonia, anxiety  ED Course Patient appears well, mildly hypertensive at 157/105, otherwise normal vitals.  NAD.  CBC shows no leukocytosis.  Mild  anemia present 11.7, though fairly consistent with previous values.  CMP does not show any significant electrolyte abnormalities, AKI, or transaminitis.  Initial troponin less than 2.  EKG unremarkable.  Unlikely ACS.  Assessment/Plan Patient presents for evaluation of brief episode of chest pain, shortness of breath, and heart palpitations that resolved spontaneously.  Reports feeling fatigued afterwards though.  Physical exam is unimpressive.  She appears clinically well at this time.  Lab work-up is reassuring.  EKG and troponin do not show any evidence of cardiac ischemia.  Chest x-ray shows no evidence of cardiomegaly or pleural effusions.  She is PERC negative.  Low suspicion for any serious or life-threatening pathology.  However, I did advise her to follow through with her cardiology appointment tomorrow for further evaluation.  Patient is agreeable to this plan.  We will discharge.  Considered admission for this patient, but given her stable presentation and close follow-up, she is unlikely to benefit from admission.  Provided the patient with anticipatory guidance, return precautions, and educational material. Encouraged the patient to return to the emergency department at any time if they begin to experience any new or worsening symptoms. Patient expressed understanding and agreed with the plan.   Patient's presentation is most consistent with acute complicated illness / injury requiring diagnostic workup.       FINAL CLINICAL IMPRESSION(S) / ED DIAGNOSES   Final diagnoses:  Atypical chest pain     Rx / DC Orders   ED Discharge Orders     None        Note:  This document was prepared using Dragon voice recognition software and may include unintentional dictation errors.   Teodoro Spray, [place] 05/13/22 1759    Duffy Bruce, MD 05/13/22 [phone]

## 2022-05-13 NOTE — ED Notes (Signed)
First Nurse Note: Patient sent to ED by [facility] for hypertension. Patient initially to [facility] for palpitations and SOB.  Denies CP at this time.  204/98 HR 112

## 2022-05-13 NOTE — ED Provider Triage Note (Signed)
Emergency Medicine Provider Triage Evaluation Note  Deborah Munoz , a 36 y.o. female  was evaluated in triage.  Pt complains of palpitations and shortness of breath. Patient with no personal cardiac HX but strong family HX. Has no CP but experiencing a racing/pounding sensation with heart beat. No pleuritic pain. No HX DVT/PE.  Review of Systems  Positive: Palpitations Negative: CP, cough, fever, GI symptoms  Physical Exam  There were no vitals taken for this visit. Gen:   Awake, no distress   Resp:  Normal effort  MSK:   Moves extremities without difficulty  Other:    Medical Decision Making  Medically screening exam initiated at 3:34 PM.  Appropriate orders placed.  JIMMY STIPES was informed that the remainder of the evaluation will be completed by another provider, this initial triage assessment does not replace that evaluation, and the importance of remaining in the ED until their evaluation is complete.  Patient to ED with palpitations and shortness of breath. EKG, chest xray, labs will be ordered   Darletta Moll, PA-C 05/13/22 1543

## 2022-05-13 NOTE — Discharge Instructions (Addendum)
-  Please follow through with your cardiology appointment tomorrow.  -Follow-up with your cardiologist or primary care provider in regards to blood pressure management.  -You may return to the emergency department anytime if you begin to experience any new or worsening symptoms.

## 2022-05-14 DIAGNOSIS — Z8249 Family history of ischemic heart disease and other diseases of the circulatory system: Secondary | ICD-10-CM | POA: Diagnosis not present

## 2022-05-14 DIAGNOSIS — R0602 Shortness of breath: Secondary | ICD-10-CM | POA: Diagnosis not present

## 2022-05-14 DIAGNOSIS — R002 Palpitations: Secondary | ICD-10-CM | POA: Diagnosis not present

## 2022-05-14 DIAGNOSIS — I10 Essential (primary) hypertension: Secondary | ICD-10-CM | POA: Diagnosis not present

## 2022-05-14 DIAGNOSIS — Z6841 Body Mass Index (BMI) 40.0 and over, adult: Secondary | ICD-10-CM | POA: Diagnosis not present

## 2022-05-20 DIAGNOSIS — M898X9 Other specified disorders of bone, unspecified site: Secondary | ICD-10-CM | POA: Diagnosis not present

## 2022-05-20 DIAGNOSIS — M2042 Other hammer toe(s) (acquired), left foot: Secondary | ICD-10-CM | POA: Diagnosis not present

## 2022-05-20 DIAGNOSIS — M778 Other enthesopathies, not elsewhere classified: Secondary | ICD-10-CM | POA: Diagnosis not present

## 2022-05-20 DIAGNOSIS — M2041 Other hammer toe(s) (acquired), right foot: Secondary | ICD-10-CM | POA: Diagnosis not present

## 2022-05-21 DIAGNOSIS — R2681 Unsteadiness on feet: Secondary | ICD-10-CM | POA: Diagnosis not present

## 2022-05-21 DIAGNOSIS — M25562 Pain in left knee: Secondary | ICD-10-CM | POA: Diagnosis not present

## 2022-05-21 DIAGNOSIS — M67864 Other specified disorders of tendon, left knee: Secondary | ICD-10-CM | POA: Diagnosis not present

## 2022-05-24 DIAGNOSIS — R2681 Unsteadiness on feet: Secondary | ICD-10-CM | POA: Diagnosis not present

## 2022-05-24 DIAGNOSIS — M67864 Other specified disorders of tendon, left knee: Secondary | ICD-10-CM | POA: Diagnosis not present

## 2022-05-24 DIAGNOSIS — M25562 Pain in left knee: Secondary | ICD-10-CM | POA: Diagnosis not present

## 2022-05-30 DIAGNOSIS — M25562 Pain in left knee: Secondary | ICD-10-CM | POA: Diagnosis not present

## 2022-05-30 DIAGNOSIS — M67864 Other specified disorders of tendon, left knee: Secondary | ICD-10-CM | POA: Diagnosis not present

## 2022-05-30 DIAGNOSIS — R2681 Unsteadiness on feet: Secondary | ICD-10-CM | POA: Diagnosis not present

## 2022-06-03 DIAGNOSIS — R0602 Shortness of breath: Secondary | ICD-10-CM | POA: Diagnosis not present

## 2022-06-07 DIAGNOSIS — M67864 Other specified disorders of tendon, left knee: Secondary | ICD-10-CM | POA: Diagnosis not present

## 2022-06-07 DIAGNOSIS — R2681 Unsteadiness on feet: Secondary | ICD-10-CM | POA: Diagnosis not present

## 2022-06-07 DIAGNOSIS — M25562 Pain in left knee: Secondary | ICD-10-CM | POA: Diagnosis not present

## 2022-06-13 DIAGNOSIS — J00 Acute nasopharyngitis [common cold]: Secondary | ICD-10-CM | POA: Diagnosis not present

## 2022-06-13 DIAGNOSIS — Z6841 Body Mass Index (BMI) 40.0 and over, adult: Secondary | ICD-10-CM | POA: Diagnosis not present

## 2022-06-14 DIAGNOSIS — R2681 Unsteadiness on feet: Secondary | ICD-10-CM | POA: Diagnosis not present

## 2022-06-14 DIAGNOSIS — M67864 Other specified disorders of tendon, left knee: Secondary | ICD-10-CM | POA: Diagnosis not present

## 2022-06-14 DIAGNOSIS — M25562 Pain in left knee: Secondary | ICD-10-CM | POA: Diagnosis not present

## 2022-06-24 DIAGNOSIS — R002 Palpitations: Secondary | ICD-10-CM | POA: Diagnosis not present

## 2022-06-24 DIAGNOSIS — Z6841 Body Mass Index (BMI) 40.0 and over, adult: Secondary | ICD-10-CM | POA: Diagnosis not present

## 2022-06-24 DIAGNOSIS — R0602 Shortness of breath: Secondary | ICD-10-CM | POA: Diagnosis not present

## 2022-06-24 DIAGNOSIS — I10 Essential (primary) hypertension: Secondary | ICD-10-CM | POA: Diagnosis not present

## 2022-06-24 DIAGNOSIS — I493 Ventricular premature depolarization: Secondary | ICD-10-CM | POA: Diagnosis not present

## 2022-06-25 ENCOUNTER — Ambulatory Visit: Payer: Medicaid Other | Admitting: Dermatology

## 2022-12-16 DIAGNOSIS — M542 Cervicalgia: Secondary | ICD-10-CM | POA: Diagnosis not present

## 2022-12-16 DIAGNOSIS — M545 Low back pain, unspecified: Secondary | ICD-10-CM | POA: Diagnosis not present

## 2022-12-30 DIAGNOSIS — R002 Palpitations: Secondary | ICD-10-CM | POA: Diagnosis not present

## 2022-12-30 DIAGNOSIS — R0602 Shortness of breath: Secondary | ICD-10-CM | POA: Diagnosis not present

## 2022-12-30 DIAGNOSIS — I10 Essential (primary) hypertension: Secondary | ICD-10-CM | POA: Diagnosis not present

## 2022-12-30 DIAGNOSIS — I493 Ventricular premature depolarization: Secondary | ICD-10-CM | POA: Diagnosis not present

## 2022-12-30 DIAGNOSIS — Z6841 Body Mass Index (BMI) 40.0 and over, adult: Secondary | ICD-10-CM | POA: Diagnosis not present

## 2023-04-22 DIAGNOSIS — Z1159 Encounter for screening for other viral diseases: Secondary | ICD-10-CM | POA: Diagnosis not present

## 2023-04-22 DIAGNOSIS — K219 Gastro-esophageal reflux disease without esophagitis: Secondary | ICD-10-CM | POA: Diagnosis not present

## 2023-04-22 DIAGNOSIS — Z0000 Encounter for general adult medical examination without abnormal findings: Secondary | ICD-10-CM | POA: Diagnosis not present

## 2023-04-22 DIAGNOSIS — E039 Hypothyroidism, unspecified: Secondary | ICD-10-CM | POA: Diagnosis not present

## 2023-05-15 DIAGNOSIS — E041 Nontoxic single thyroid nodule: Secondary | ICD-10-CM | POA: Diagnosis not present

## 2023-06-04 DIAGNOSIS — E041 Nontoxic single thyroid nodule: Secondary | ICD-10-CM | POA: Diagnosis not present

## 2023-07-01 DIAGNOSIS — E041 Nontoxic single thyroid nodule: Secondary | ICD-10-CM | POA: Diagnosis not present

## 2023-07-07 ENCOUNTER — Ambulatory Visit (INDEPENDENT_AMBULATORY_CARE_PROVIDER_SITE_OTHER): Payer: Medicaid Other | Admitting: Internal Medicine

## 2023-07-07 VITALS — BP 135/102 | HR 82 | Resp 14 | Ht 63.0 in | Wt 286.0 lb

## 2023-07-07 DIAGNOSIS — G4710 Hypersomnia, unspecified: Secondary | ICD-10-CM | POA: Diagnosis not present

## 2023-07-07 DIAGNOSIS — R0683 Snoring: Secondary | ICD-10-CM | POA: Insufficient documentation

## 2023-07-07 NOTE — Progress Notes (Unsigned)
Sleep Medicine   Office Visit  Patient Name: Deborah Munoz DOB: Nov 21, 1985 MRN [id number]    Chief Complaint: sleep evaluation  Brief History:  [place] presents for an initial sleep consultation with a long history of snoring.    Sleep quality is poor. This is noted most nights. The patient's bed partner reports  loud snoring at night. The patient relates the following symptoms: loud snoring, excessive daytime sleepiness and non restorative sleep are also present. The patient goes to sleep at 10 pm and wakes up at 6:30-7 am.  Sleep quality is same when outside home environment.  Patient has noted no restlessness of her legs at night.  The patient  relates no unusual behavior during the night.  The patient denies a history of psychiatric problems. The Epworth Sleepiness Score is 19 out of 24 .  The patient relates  Cardiovascular risk factors include: elevated blood pressure.    ROS  General: (-) fever, (-) chills, (-) night sweat Nose and Sinuses: (-) nasal stuffiness or itchiness, (-) postnasal drip, (-) nosebleeds, (-) sinus trouble. Mouth and Throat: (-) sore throat, (-) hoarseness. Neck: (-) swollen glands, (-) enlarged thyroid, (-) neck pain. Respiratory: - cough, - shortness of breath, - wheezing. Neurologic: - numbness, - tingling. Psychiatric: - anxiety, - depression Sleep behavior: -sleep paralysis -hypnogogic hallucinations -dream enactment      -vivid dreams -cataplexy -night terrors -sleep walking   Current Medication: Outpatient Encounter Medications as of 07/07/2023  Medication Sig   Cholecalciferol (VITAMIN D3) 2000 units capsule Take 1 capsule (2,000 Units total) by mouth daily.   ELIDEL 1 % cream Apply to affected areas eczema and skin folds once to twice daily until rash improved.   EPINEPHrine 0.3 mg/0.3 mL IJ SOAJ injection Inject 0.3 mLs (0.3 mg total) into the muscle as needed for anaphylaxis (seafood/shelfish allergy). (Patient not taking: Reported on  02/11/2020)   fluticasone (FLONASE) 50 MCG/ACT nasal spray SPRAY 2 SPRAYS INTO EACH NOSTRIL EVERY DAY   Halcinonide 0.1 % CREA Apply to more severe areas eczema once to twice daily until improved. Avoid face, groin, underarms, and skin folds.   ketoconazole (NIZORAL) 2 % cream Apply to skin folds once to twice daily until improved.   meloxicam (MOBIC) 15 MG tablet TAKE 1 TABLET BY MOUTH EVERY DAY   Multiple Vitamin (MULTIVITAMIN) tablet Take 1 tablet by mouth daily. Reported on 10/20/2015   WEGOVY 0.25 MG/0.5ML SOAJ SMARTSIG:0.5 Milliliter(s) SUB-Q Once a Week   No facility-administered encounter medications on file as of 07/07/2023.    Surgical History: Past Surgical History:  Procedure Laterality Date   BARIATRIC SURGERY     july 5    CHOLECYSTECTOMY     ESOPHAGOGASTRODUODENOSCOPY (EGD) WITH PROPOFOL N/A 02/04/2017   Procedure: ESOPHAGOGASTRODUODENOSCOPY (EGD) WITH PROPOFOL;  Surgeon: Midge Minium, MD;  Location: [facility] ENDOSCOPY;  Service: Endoscopy;  Laterality: N/A;   HAND SURGERY      Medical History: Past Medical History:  Diagnosis Date   History of gallstones    Hypertension    Hypothyroidism    Morbid obesity (HCC) 05/08/2012   Thyroid disease     Family History: Non contributory to the present illness  Social History: Social History   Socioeconomic History   Marital status: Married    Spouse name: Not on file   Number of children: 2   Years of education: Not on file   Highest education level: Some college, no degree  Occupational History   Not on file  Tobacco  Use   Smoking status: Never   Smokeless tobacco: Never  Vaping Use   Vaping status: Never Used  Substance and Sexual Activity   Alcohol use: Yes    Alcohol/week: 0.0 standard drinks of alcohol    Comment: occasionally   Drug use: No   Sexual activity: Yes    Birth control/protection: Surgical    Comment: Pt husband has had vasectomy   Other Topics Concern   Not on file  Social History Narrative    Not on file   Social Determinants of Health   Financial Resource Strain: Medium Risk (04/18/2023)   [facility] from [facility]   Overall Financial Resource Strain (CARDIA)    Difficulty of Paying Living Expenses: Somewhat hard  Food Insecurity: Food Insecurity Present (04/18/2023)   Received from [facility]   Hunger Vital Sign    Worried About Running Out of Food in the Last Year: Sometimes true    Ran Out of Food in the Last Year: Sometimes true  Transportation Needs: No Transportation Needs (04/18/2023)   Received from [facility] - Transportation    In the past 12 months, has lack of transportation kept you from medical appointments or from getting medications?: No    Lack of Transportation (Non-Medical): No  Physical Activity: Inactive (03/11/2019)   Exercise Vital Sign    Days of Exercise per Week: 0 days    Minutes of Exercise per Session: 0 min  Stress: No Stress Concern Present (03/11/2019)   [employer] - Occupational Stress Questionnaire    Feeling of Stress : Not at all  Social Connections: Not on file  Intimate Partner Violence: Not on file    Vital Signs: Blood pressure (!) 135/102, pulse 82, resp. rate 14, height 5\' 3"  (1.6 m), weight 286 lb (129.7 kg), SpO2 99%. Body mass index is 50.66 kg/m.   Examination: General Appearance: The patient is well-developed, well-nourished, and in no distress. Neck Circumference: 38 cm Skin: Gross inspection of skin unremarkable. Head: normocephalic, no gross deformities. Eyes: no gross deformities noted. ENT: ears appear grossly normal Neurologic: Alert and oriented. No involuntary movements.    STOP BANG RISK ASSESSMENT S (snore) Have you been told that you snore?     YES   T (tired) Are you often tired, fatigued, or sleepy during the day?   YES  O (obstruction) Do you stop breathing, choke, or gasp during sleep? NO   P  (pressure) Do you have or are you being treated for high blood pressure? NO   B (BMI) Is your body index greater than 35 kg/m? YES   A (age) Are you 4 years old or older? NO   N (neck) Do you have a neck circumference greater than 16 inches?   NO  G (gender) Are you a female? NO   TOTAL STOP/BANG "YES" ANSWERS 3                                                               A STOP-Bang score of 2 or less is considered low risk, and a score of 5 or more is high risk for having either moderate or severe OSA. For people who score 3 or 4, doctors may  need to perform further assessment to determine how likely they are to have OSA.         EPWORTH SLEEPINESS SCALE:  Scale:  (0)= no chance of dozing; (1)= slight chance of dozing; (2)= moderate chance of dozing; (3)= high chance of dozing  Chance  Situtation    Sitting and reading: 3    Watching TV: 3    Sitting Inactive in public: 2    As a passenger in car: 3      Lying down to rest: 3    Sitting and talking: 2    Sitting quielty after lunch: 2    In a car, stopped in traffic: 1   TOTAL SCORE:   19 out of 24    SLEEP STUDIES:  Had a home and PSG in 2017 - neither had a conclusive result   LABS: No results found for this or any previous visit (from the past 2160 hour(s)).  Radiology: DG Chest 2 View  Result Date: 05/13/2022 CLINICAL DATA:  Palpitations with shortness of breath. EXAM: CHEST - 2 VIEW COMPARISON:  Radiographs 02/05/2017. FINDINGS: The heart size and mediastinal contours are normal. The lungs are clear. There is no pleural effusion or pneumothorax. No acute osseous findings are identified. IMPRESSION: Stable chest.  No active cardiopulmonary process. Electronically Signed   By: Carey Bullocks M.D.   On: 05/13/2022 16:23    No results found.  No results found.    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 Snoring Patient evaluation suggests high risk of sleep disordered breathing due to snoring, excessive daytime sleepiness,  loud snoring, morbid obesity. Patient has comorbid cardiovascular risk factors including: el;evated blood pressure  which could be exacerbated by pathologic sleep-disordered breathing.  Suggest: PSG  to assess/treat the patient's sleep disordered breathing. The patient was also counselled on weight loss to optimize sleep health.  2. Hypersomnia Pt has significant hypersomnia with Epworth score of 19/24. If PSG is negative pt will require further evaluation with [facility]  3. Morbid obesity (HCC) Obesity Counseling: Had a lengthy discussion regarding patients BMI and weight issues. Patient was instructed on portion control as well as increased activity. Also discussed caloric restrictions with trying to maintain intake less than 2000 Kcal. Discussions were made in accordance with the 5As of weight management. Simple actions such as not eating late and if able to, taking a walk is suggested.      General Counseling: I have discussed the findings of the evaluation and examination with [place].  I have also discussed any further diagnostic evaluation thatmay be needed or ordered today. [place] verbalizes understanding of the findings of todays visit. We also reviewed her medications today and discussed drug interactions and side effects including but not limited excessive drowsiness and altered mental states. We also discussed that there is always a risk not just to her but also people  around her. she has been encouraged to call the office with any questions or concerns that should arise related to todays visit.  No orders  of the defined types were placed in this encounter.       I have personally obtained a history, evaluated the patient, evaluated pertinent data, formulated the assessment and plan and placed orders.   This patient was seen today by Emmaline Kluver, PA-C in collaboration with Dr. Freda Munro.   Yevonne Pax, MD [facility] Diplomate ABMS Pulmonary and Critical Care Medicine Sleep medicine

## 2023-07-15 DIAGNOSIS — F411 Generalized anxiety disorder: Secondary | ICD-10-CM | POA: Diagnosis not present

## 2023-07-15 DIAGNOSIS — F321 Major depressive disorder, single episode, moderate: Secondary | ICD-10-CM | POA: Diagnosis not present

## 2023-07-15 DIAGNOSIS — E039 Hypothyroidism, unspecified: Secondary | ICD-10-CM | POA: Diagnosis not present

## 2023-07-15 DIAGNOSIS — E041 Nontoxic single thyroid nodule: Secondary | ICD-10-CM | POA: Diagnosis not present

## 2023-07-15 DIAGNOSIS — F410 Panic disorder [episodic paroxysmal anxiety] without agoraphobia: Secondary | ICD-10-CM | POA: Diagnosis not present

## 2023-07-23 DIAGNOSIS — G4730 Sleep apnea, unspecified: Secondary | ICD-10-CM | POA: Diagnosis not present

## 2023-08-01 DIAGNOSIS — G4719 Other hypersomnia: Secondary | ICD-10-CM | POA: Diagnosis not present

## 2023-08-01 DIAGNOSIS — E039 Hypothyroidism, unspecified: Secondary | ICD-10-CM | POA: Diagnosis not present

## 2023-08-01 DIAGNOSIS — Z6841 Body Mass Index (BMI) 40.0 and over, adult: Secondary | ICD-10-CM | POA: Diagnosis not present

## 2023-08-01 DIAGNOSIS — F411 Generalized anxiety disorder: Secondary | ICD-10-CM | POA: Diagnosis not present

## 2023-08-01 DIAGNOSIS — E041 Nontoxic single thyroid nodule: Secondary | ICD-10-CM | POA: Diagnosis not present

## 2023-09-05 DIAGNOSIS — G4733 Obstructive sleep apnea (adult) (pediatric): Secondary | ICD-10-CM | POA: Diagnosis not present

## 2023-10-06 DIAGNOSIS — J209 Acute bronchitis, unspecified: Secondary | ICD-10-CM | POA: Diagnosis not present

## 2023-10-06 DIAGNOSIS — R059 Cough, unspecified: Secondary | ICD-10-CM | POA: Diagnosis not present

## 2023-11-25 ENCOUNTER — Ambulatory Visit: Payer: Self-pay | Admitting: Podiatry

## 2023-11-25 DIAGNOSIS — Q666 Other congenital valgus deformities of feet: Secondary | ICD-10-CM

## 2023-11-25 NOTE — Progress Notes (Unsigned)
 Subjective:  Patient ID: Deborah Munoz, female    DOB: Oct 20, 1985,  MRN: [id number]  Chief Complaint  Patient presents with   Toe Pain    Left foot 5th toe pain     38 y.o. female presents with the above complaint.  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  Pain scale is 5 out of 10 dull aching nature hurts in heels and arch   Review of Systems: Negative except as noted in the HPI. Denies N/V/F/Ch.  Past Medical History:  Diagnosis Date   History of gallstones    Hypertension    Hypothyroidism    Morbid obesity (HCC) 05/08/2012   Thyroid disease     Current Outpatient Medications:    Cholecalciferol (VITAMIN D3) 2000 units capsule, Take 1 capsule (2,000 Units total) by mouth daily., Disp: , Rfl:    ELIDEL 1 % cream, Apply to affected areas eczema and skin folds once to twice daily until rash improved., Disp: 100 g, Rfl: 3   EPINEPHrine 0.3 mg/0.3 mL IJ SOAJ injection, Inject 0.3 mLs (0.3 mg total) into the muscle as needed for anaphylaxis (seafood/shelfish allergy). (Patient not taking: Reported on 02/11/2020), Disp: 2 each, Rfl: 1   fluticasone (FLONASE) 50 MCG/ACT nasal spray, SPRAY 2 SPRAYS INTO EACH NOSTRIL EVERY DAY, Disp: 48 mL, Rfl: 0   Halcinonide 0.1 % CREA, Apply to more severe areas eczema once to twice daily until improved. Avoid face, groin, underarms, and skin folds., Disp: 60 g, Rfl: 2   ketoconazole (NIZORAL) 2 % cream, Apply to skin folds once to twice daily until improved., Disp: 60 g, Rfl: 3   meloxicam (MOBIC) 15 MG tablet, TAKE 1 TABLET BY MOUTH EVERY DAY, Disp: 30 tablet, Rfl: 5   Multiple Vitamin (MULTIVITAMIN) tablet, Take 1 tablet by mouth daily. Reported on 10/20/2015, Disp: , Rfl:    WEGOVY 0.25 MG/0.5ML SOAJ,  SMARTSIG:0.5 Milliliter(s) SUB-Q Once a Week, Disp: , Rfl:   Social History   Tobacco Use  Smoking Status Never  Smokeless Tobacco Never    Allergies  Allergen Reactions   Shrimp [Shellfish Allergy]    Objective:  There were no vitals filed for this visit. There is no height or weight on file to calculate BMI. Constitutional Well developed. Well nourished.  Vascular Dorsalis pedis pulses palpable bilaterally. Posterior tibial pulses palpable bilaterally. Capillary refill normal to all digits.  No cyanosis or clubbing noted. Pedal hair growth normal.  Neurologic Normal speech. Oriented to person, place, and time. Epicritic sensation to light touch grossly present bilaterally.  Dermatologic Nails well groomed and normal in appearance. No open wounds. No skin lesions.  Orthopedic: Gait examination shows pes planovalgus with calcaneovalgus to many toe signs partially but recurred the arch with dorsiflexion of the hallux.  Unable to perform single and double heel raise   Radiographs: None Assessment:   1. Pes planovalgus    Plan:  Patient was evaluated and treated and all questions answered.  Pes planovalgus -I explained to patient the etiology of pes planovalgus and relationship with Planter fasciitis and various treatment options were discussed.  Given patient foot structure in the setting of Planter fasciitis I believe patient will benefit from custom-made orthotics to help control the hindfoot motion support the arch of the foot  and take the stress away from plantar fascial.  Patient agrees with the plan like to proceed with orthotics -Patient was casted for orthotics   No follow-ups on file.

## 2023-12-01 NOTE — Progress Notes (Signed)
  Orthotic order placed will schedule for fitting when in

## 2023-12-15 ENCOUNTER — Telehealth: Payer: Self-pay

## 2023-12-15 NOTE — Telephone Encounter (Signed)
 Will be giving orthotics to dr patel to take to [place].

## 2023-12-30 ENCOUNTER — Other Ambulatory Visit: Payer: Self-pay

## 2024-02-24 ENCOUNTER — Ambulatory Visit (INDEPENDENT_AMBULATORY_CARE_PROVIDER_SITE_OTHER): Admitting: Podiatry

## 2024-02-24 DIAGNOSIS — Q666 Other congenital valgus deformities of feet: Secondary | ICD-10-CM

## 2024-02-24 NOTE — Progress Notes (Signed)
 Orhtoics dipsebnsed functioning well.  No complication noted.  The break-in instructions were discussed

## 2024-04-02 ENCOUNTER — Ambulatory Visit: Payer: Self-pay

## 2024-04-02 NOTE — Telephone Encounter (Signed)
 FYI Only or Action Required?: FYI only for provider.  Patient was last seen in primary care on na.  Called Nurse Triage reporting Fatigue.  Symptoms began several years ago.  Interventions attempted: Rest, hydration, or home remedies.  Symptoms are: unchanged.  Triage Disposition: See PCP Within 2 Weeks  Patient/caregiver understands and will follow disposition?: UnsureCopied from CRM [id number]. Topic: Clinical - Red Word Triage >> Apr 02, 2024  9:58 AM Robinson H wrote: [facility] that prompted transfer to Nurse Triage: Not diabetic but feels like she's having sugar issues, jittery, feels really Jomarie had to hold on to something very fatigue, dizzy Reason for Disposition  Fatigue is a chronic symptom (recurrent or ongoing AND present > 4 weeks)  Answer Assessment - Initial Assessment Questions 1. DESCRIPTION: Describe how you are feeling.     Just tried  2. SEVERITY: How bad is it?  Can you stand and walk?     yes 3. ONSET: When did these symptoms begin? (e.g., hours, days, weeks, months)     Long time 4. CAUSE: What do you think is causing the weakness or fatigue? (e.g., not drinking enough fluids, medical problem, trouble sleeping)     Concerned she has diabetes.  5. NEW MEDICINES:  Have you started on any new medicines recently? (e.g., opioid pain medicines, benzodiazepines, muscle relaxants, antidepressants, antihistamines, neuroleptics, beta blockers)     Na  6. OTHER SYMPTOMS: Do you have any other symptoms? (e.g., chest pain, fever, cough, SOB, vomiting, diarrhea, bleeding, other areas of pain)     Shaky       Pt is always tired but became jittery/clammy yesterday. Pt has taken [facility] before due to prediabetic symptoms.  Pt feels off between meals. Pt's mother is diabetic. RN gave advice on diet and symptoms to watch for. RN advised pt seek care at [facility] if symptoms worsen. Pt is calling weight loss office to see if she can be see today.  Protocols used:  Weakness (Generalized) and Fatigue-A-AH

## 2024-07-01 ENCOUNTER — Ambulatory Visit (INDEPENDENT_AMBULATORY_CARE_PROVIDER_SITE_OTHER): Payer: Self-pay | Admitting: Family

## 2024-07-01 ENCOUNTER — Encounter: Payer: Self-pay | Admitting: Family

## 2024-07-01 ENCOUNTER — Ambulatory Visit: Payer: Self-pay | Admitting: Family

## 2024-07-01 VITALS — BP 134/82 | HR 103 | Temp 98.8°F | Ht 63.0 in | Wt 294.4 lb

## 2024-07-01 DIAGNOSIS — G4719 Other hypersomnia: Secondary | ICD-10-CM

## 2024-07-01 DIAGNOSIS — R7303 Prediabetes: Secondary | ICD-10-CM | POA: Diagnosis not present

## 2024-07-01 DIAGNOSIS — T7802XD Anaphylactic reaction due to shellfish (crustaceans), subsequent encounter: Secondary | ICD-10-CM

## 2024-07-01 DIAGNOSIS — L409 Psoriasis, unspecified: Secondary | ICD-10-CM | POA: Diagnosis not present

## 2024-07-01 DIAGNOSIS — T7802XA Anaphylactic reaction due to shellfish (crustaceans), initial encounter: Secondary | ICD-10-CM | POA: Insufficient documentation

## 2024-07-01 DIAGNOSIS — Z13220 Encounter for screening for lipoid disorders: Secondary | ICD-10-CM | POA: Diagnosis not present

## 2024-07-01 DIAGNOSIS — G8929 Other chronic pain: Secondary | ICD-10-CM

## 2024-07-01 DIAGNOSIS — M25561 Pain in right knee: Secondary | ICD-10-CM

## 2024-07-01 DIAGNOSIS — E041 Nontoxic single thyroid nodule: Secondary | ICD-10-CM

## 2024-07-01 DIAGNOSIS — R0683 Snoring: Secondary | ICD-10-CM

## 2024-07-01 DIAGNOSIS — R7689 Other specified abnormal immunological findings in serum: Secondary | ICD-10-CM

## 2024-07-01 DIAGNOSIS — M2550 Pain in unspecified joint: Secondary | ICD-10-CM | POA: Diagnosis not present

## 2024-07-01 DIAGNOSIS — D500 Iron deficiency anemia secondary to blood loss (chronic): Secondary | ICD-10-CM

## 2024-07-01 DIAGNOSIS — M25562 Pain in left knee: Secondary | ICD-10-CM

## 2024-07-01 DIAGNOSIS — R700 Elevated erythrocyte sedimentation rate: Secondary | ICD-10-CM

## 2024-07-01 DIAGNOSIS — Z8679 Personal history of other diseases of the circulatory system: Secondary | ICD-10-CM | POA: Insufficient documentation

## 2024-07-01 DIAGNOSIS — L301 Dyshidrosis [pompholyx]: Secondary | ICD-10-CM

## 2024-07-01 LAB — LIPID PANEL
Cholesterol: 179 mg/dL (ref 0–200)
HDL: 52 mg/dL (ref >39.00)
LDL Cholesterol: 109 mg/dL — ABNORMAL HIGH (ref 0–99)
NonHDL: 127.32
Total CHOL/HDL Ratio: 3
Triglycerides: 91 mg/dL (ref 0.0–149.0)
VLDL: 18.2 mg/dL (ref 0.0–40.0)

## 2024-07-01 LAB — BASIC METABOLIC PANEL WITH GFR
BUN: 15 mg/dL (ref 6–23)
CO2: 27 meq/L (ref 19–32)
Calcium: 8.7 mg/dL (ref 8.4–10.5)
Chloride: 102 meq/L (ref 96–112)
Creatinine, Ser: 0.79 mg/dL (ref 0.40–1.20)
GFR: 94.99 mL/min (ref >60.00)
Glucose, Bld: 90 mg/dL (ref 70–99)
Potassium: 4.3 meq/L (ref 3.5–5.1)
Sodium: 137 meq/L (ref 135–145)

## 2024-07-01 LAB — IBC + FERRITIN
Ferritin: 17.1 ng/mL (ref 10.0–291.0)
Iron: 80 ug/dL (ref 42–145)
Saturation Ratios: 17.9 % — ABNORMAL LOW (ref 20.0–50.0)
TIBC: 448 ug/dL (ref 250.0–450.0)
Transferrin: 320 mg/dL (ref 212.0–360.0)

## 2024-07-01 LAB — C-REACTIVE PROTEIN: CRP: 1.4 mg/dL (ref 0.5–20.0)

## 2024-07-01 LAB — TSH: TSH: 4.06 u[IU]/mL (ref 0.35–5.50)

## 2024-07-01 LAB — SEDIMENTATION RATE: Sed Rate: 54 mm/h — ABNORMAL HIGH (ref 0–20)

## 2024-07-01 LAB — HEMOGLOBIN A1C: Hgb A1c MFr Bld: 6 % (ref 4.6–6.5)

## 2024-07-01 LAB — T4, FREE: Free T4: 1.08 ng/dL (ref 0.60–1.60)

## 2024-07-01 MED ORDER — MELOXICAM 15 MG PO TABS: 15.0000 mg | ORAL_TABLET | Freq: Every day | ORAL | 1 refills | Status: AC | PRN

## 2024-07-01 MED ORDER — EPINEPHRINE 0.3 MG/0.3ML IJ SOAJ: 0.3000 mg | INTRAMUSCULAR | 1 refills | Status: AC | PRN

## 2024-07-01 MED ORDER — CLOBETASOL PROPIONATE 0.05 % EX CREA: 1.0000 | TOPICAL_CREAM | Freq: Two times a day (BID) | CUTANEOUS | 0 refills | Status: AC

## 2024-07-01 NOTE — Progress Notes (Signed)
 Established Patient Office Visit  Subjective:      CC:  Chief Complaint  Patient presents with   Establish Care    HPI: Deborah Munoz is a 38 y.o. female presenting on 07/01/2024 for Establish Care Prior pcp Lorri Cower NP   Discussed the use of AI scribe software for clinical note transcription with the patient, who gave verbal consent to proceed.  History of Present Illness Deborah Munoz is a 38 year old female with psoriatic arthritis and sleep disturbances who presents for medication management and allergy concerns.  She has a history of allergies, including a shellfish allergy, and carries an EpiPen , although she has never used it. She is interested in obtaining a new prescription for the EpiPen  and inquires about a nasal spray option. She takes spirulina for energy, which has helped with daytime fatigue, but is cautious about trying CMOS due to potential allergic reactions.  She experiences significant daytime fatigue and underwent a sleep study within the last six months, which was inconclusive. A previous home sleep study ruled out sleep apnea. Her husband has not noticed any breathing interruptions during sleep, although she snores occasionally and feels tired throughout the day. She does not frequently experience morning headaches. Spirulina has helped reduce her daytime sleepiness, which previously caused her to fall asleep at work.  She has a history of dyshidrotic eczema and scalp dermatitis. She uses various creams, including a prescription from dermatology, which was effective but expensive. A generic version was ineffective. She also uses Aquaphor for her skin condition. Stress exacerbates her skin issues.  She has psoriatic arthritis and experiences joint pain, particularly in her knees and back. She takes meloxicam  as needed for joint pain, which occurs a couple of times a week. She also experiences morning stiffness, particularly in her knees and back. There is a  family history of Crohn's disease on her paternal side.  She underwent bariatric surgery (sleeve gastrectomy) in July 2018 and had her gallbladder removed due to gallstones. She has a history of low iron levels and was previously on thyroid  medication for about ten years. She is scheduled to see an endocrinologist for follow-up on a thyroid  nodule, which was previously determined to be non-cancerous.  She has a history of hypertension and used to take blood pressure medication. Her blood pressure at home averages around 130/80 mmHg, and she monitors it regularly.          Social history:  Relevant past medical, surgical, family and social history reviewed and updated as indicated. Interim medical history since our last visit reviewed.  Allergies and medications reviewed and updated.  DATA REVIEWED: CHART IN EPIC     ROS: Negative unless specifically indicated above in HPI.    Current Outpatient Medications:    Cholecalciferol (VITAMIN D3) 2000 units capsule, Take 1 capsule (2,000 Units total) by mouth daily., Disp: , Rfl:    clobetasol  cream (TEMOVATE ) 0.05 %, Apply 1 Application topically 2 (two) times daily., Disp: 30 g, Rfl: 0   fluticasone  (FLONASE ) 50 MCG/ACT nasal spray, SPRAY 2 SPRAYS INTO EACH NOSTRIL EVERY DAY, Disp: 48 mL, Rfl: 0   Multiple Vitamin (MULTIVITAMIN) tablet, Take 1 tablet by mouth daily. Reported on 10/20/2015, Disp: , Rfl:    EPINEPHrine  0.3 mg/0.3 mL IJ SOAJ injection, Inject 0.3 mg into the muscle as needed for anaphylaxis (seafood/shelfish allergy)., Disp: 2 each, Rfl: 1   meloxicam  (MOBIC ) 15 MG tablet, Take 1 tablet (15 mg total) by mouth daily as  needed for pain., Disp: 90 tablet, Rfl: 1        Objective:        BP 134/82 (BP Location: Left Arm, Patient Position: Sitting, Cuff Size: Large)   Pulse (!) 103   Temp 98.8 F (37.1 C) (Temporal)   Ht 5' 3 (1.6 m)   Wt 294 lb 6.4 oz (133.5 kg)   LMP 06/18/2024 (Exact Date)   SpO2 98%   BMI  52.15 kg/m   Physical Exam VITALS: BP- 130/ MUSCULOSKELETAL: No knee tenderness on palpation. Knee with good range of motion, stiffness noted.  Wt Readings from Last 3 Encounters:  07/01/24 294 lb 6.4 oz (133.5 kg)  07/07/23 286 lb (129.7 kg)  05/13/22 274 lb 7.6 oz (124.5 kg)    Physical Exam Vitals reviewed.  Constitutional:      General: She is not in acute distress.    Appearance: Normal appearance. She is obese. She is not ill-appearing, toxic-appearing or diaphoretic.  HENT:     Head: Normocephalic.  Cardiovascular:     Rate and Rhythm: Normal rate and regular rhythm.  Pulmonary:     Effort: Pulmonary effort is normal.     Breath sounds: Normal breath sounds.  Musculoskeletal:        General: Normal range of motion.  Skin:    Comments: Scaly patches proximal knuckles extending from 1st to 4th metacarpal with silver scaling   Scaly textured skin right hand on all fingers  Neurological:     General: No focal deficit present.     Mental Status: She is alert and oriented to person, place, and time. Mental status is at baseline.  Psychiatric:        Mood and Affect: Mood normal.        Behavior: Behavior normal.        Thought Content: Thought content normal.        Judgment: Judgment normal.          Results DIAGNOSTIC Sleep Study: Inconclusive (Spring 2025) Endoscopy: Pre-bariatric surgery evaluation (Summer 2018)  Assessment & Plan:   Assessment and Plan Assessment & Plan Shellfish allergy with risk of anaphylaxis Severe shellfish allergy with risk of anaphylaxis. She has an EpiPen  but requires a new prescription. Discussion about the availability of a nasal spray alternative, but insurance coverage is uncertain. - Prescribe EpiPen   Hand dermatitis, likely psoriasis vs dyshidrotic eczema Chronic hand dermatitis, likely psoriasis vs dyshidrotic eczema. Previous treatment with ketoconazole  cream was ineffective. Symptoms include raw and itchy skin folds.  Stress may exacerbate the condition. - Prescribe clobetasol  cream - Advise use of Aquaphor at night with gloves - Discontinue ketoconazole  cream  Knee and back pain with morning stiffness, possible osteoarthritis Chronic knee and back pain with morning stiffness, suggestive of possible osteoarthritis. Pain managed with meloxicam  as needed. Discussion about the potential impact of weight on joint pain. - Prescribe meloxicam  as needed - Encourage weight management to alleviate joint pain  Obesity, post-bariatric surgery Obesity status post-bariatric surgery in 2018. Insurance does not cover [employer]. Discussion about potential coverage for weight loss medications if sleep apnea is confirmed. Consideration of alternative medications like Qsymia or Contrave  if GLP-1 agonists are not covered. - Order home sleep study to assess for sleep apnea - Discuss potential use of Qsymia or Contrave  if GLP-1 agonists are not covered  Sleep disturbance, possible sleep apnea Sleep disturbance with symptoms suggestive of possible sleep apnea, including daytime fatigue and snoring. Previous sleep study was inconclusive. Discussion about  the potential impact of sleep apnea on insurance coverage for weight loss medications. - Order home sleep study to assess for sleep apnea  Borderline hypertension Borderline hypertension with home readings averaging around 130/80 mmHg. Discussion about the potential need for antihypertensive medication if readings remain elevated. - Advise home blood pressure monitoring for one week - Consider starting losartan 25 mg if home readings remain elevated  Iron deficiency anemia Iron deficiency anemia with previous reports of microcytic anemia. Discussion about the impact of iron deficiency on red blood cell size and color. - Order labs to check iron levels and complete blood count  Thyroid  nodule, under endocrinology care Thyroid  nodule under endocrinology care. Previous evaluation  ruled out medullary thyroid  carcinoma. Upcoming endocrinology appointment for further evaluation and ultrasound. - Do not order thyroid  labs as she is under endocrinology care        Return in about 6 months (around 12/30/2024) for f/u CPE.     Ginger Patrick, MSN, APRN, FNP-C [facility]

## 2024-07-02 ENCOUNTER — Encounter: Payer: Self-pay | Admitting: *Deleted

## 2024-07-03 LAB — ANA TITER AND PATTERN REFLEX
ANA TITER: 1:80 {titer} — ABNORMAL HIGH
ANA Titer 1: 1:80 {titer} — ABNORMAL HIGH

## 2024-07-03 LAB — ANA SCREEN,IFA, WITH REFLEX TO TITER AND PATTERN (REFL): ANA SCREEN, IFA: POSITIVE — AB

## 2024-07-03 LAB — RHEUMATOID FACTOR: Rheumatoid fact SerPl-aCnc: <10 [IU]/mL (ref <14)

## 2024-07-06 ENCOUNTER — Ambulatory Visit: Admitting: Family

## 2024-07-13 ENCOUNTER — Ambulatory Visit (INDEPENDENT_AMBULATORY_CARE_PROVIDER_SITE_OTHER): Admitting: Family

## 2024-07-13 ENCOUNTER — Encounter: Payer: Self-pay | Admitting: Family

## 2024-07-13 VITALS — BP 126/82 | HR 100 | Temp 98.3°F | Ht 63.0 in | Wt 289.4 lb

## 2024-07-13 DIAGNOSIS — R051 Acute cough: Secondary | ICD-10-CM

## 2024-07-13 NOTE — Progress Notes (Signed)
 Established Patient Office Visit  Subjective:      CC:  Chief Complaint  Patient presents with   Acute Visit    Possible ear infection, sinus congestion/pressure. Symptoms started 5 days ago.   lkllll HPI: Deborah Munoz is a 38 y.o. female presenting on 07/13/2024 for Acute Visit (Possible ear infection, sinus congestion/pressure. Symptoms started 5 days ago.) .  Discussed the use of AI scribe software for clinical note transcription with the patient, who gave verbal consent to proceed.  History of Present Illness Deborah Munoz is a 38 year old female who presents with upper respiratory symptoms and ear pressure.  She began experiencing symptoms six days ago, initially with sneezing throughout the day and a feeling of exhaustion in the evening. Since then, she has had congestion, described as 'always moving' and 'flowing', but her nasal passages have not been completely blocked, allowing her to breathe through her nose.  She reports pressure and fullness in her ears, particularly in the right ear, but denies any pain except for a sharp pain in her right ear experienced yesterday. She is concerned about the possibility of an ear infection.  She began coughing last night, describing it as a dry cough that persisted throughout the night, causing significant discomfort. She was unable to produce mucus either by blowing her nose or coughing. To manage the cough, she drank water and used sugar-free cough drops.  She has been self-medicating with over-the-counter remedies, including sugar-free cough drops and benzonatate, which she finds ineffective. She also takes Zyrtec year-round for allergies. No fever, nasal obstruction, or current sore throat. She experienced a sore throat on Friday which improved later that day.                                                                                                                  Social history:  Relevant past medical,  surgical, family and social history reviewed and updated as indicated. Interim medical history since our last visit reviewed.  Allergies and medications reviewed and updated.  DATA REVIEWED: CHART IN EPIC     ROS: Negative unless specifically indicated above in HPI.    Current Outpatient Medications:    Cholecalciferol (VITAMIN D3) 2000 units capsule, Take 1 capsule (2,000 Units total) by mouth daily., Disp: , Rfl:    clobetasol  cream (TEMOVATE ) 0.05 %, Apply 1 Application topically 2 (two) times daily., Disp: 30 g, Rfl: 0   EPINEPHrine  0.3 mg/0.3 mL IJ SOAJ injection, Inject 0.3 mg into the muscle as needed for anaphylaxis (seafood/shelfish allergy)., Disp: 2 each, Rfl: 1   fluticasone  (FLONASE ) 50 MCG/ACT nasal spray, SPRAY 2 SPRAYS INTO EACH NOSTRIL EVERY DAY, Disp: 48 mL, Rfl: 0   meloxicam  (MOBIC ) 15 MG tablet, Take 1 tablet (15 mg total) by mouth daily as needed for pain., Disp: 90 tablet, Rfl: 1   Multiple Vitamin (MULTIVITAMIN) tablet, Take 1 tablet by mouth daily. Reported on 10/20/2015, Disp: , Rfl:         Objective:  BP 126/82 (BP Location: Left Arm, Patient Position: Sitting, Cuff Size: Large)   Pulse 100   Temp 98.3 F (36.8 C) (Temporal)   Ht 5' 3 (1.6 m)   Wt 289 lb 6.4 oz (131.3 kg)   LMP 06/18/2024 (Exact Date)   SpO2 98%   BMI 51.26 kg/m   Physical Exam CHEST: Lungs clear to auscultation bilaterally.  Wt Readings from Last 3 Encounters:  07/13/24 289 lb 6.4 oz (131.3 kg)  07/01/24 294 lb 6.4 oz (133.5 kg)  07/07/23 286 lb (129.7 kg)    Physical Exam Constitutional:      General: She is not in acute distress.    Appearance: Normal appearance. She is normal weight. She is not ill-appearing, toxic-appearing or diaphoretic.  HENT:     Head: Normocephalic.     Right Ear: Tympanic membrane normal.     Left Ear: Tympanic membrane normal.     Nose: Nose normal.     Right Turbinates: Swollen.     Left Turbinates: Swollen.     Right Sinus: No  maxillary sinus tenderness or frontal sinus tenderness.     Left Sinus: No maxillary sinus tenderness or frontal sinus tenderness.     Mouth/Throat:     Mouth: Mucous membranes are dry.     Pharynx: No oropharyngeal exudate or posterior oropharyngeal erythema.  Eyes:     Extraocular Movements: Extraocular movements intact.     Pupils: Pupils are equal, round, and reactive to light.  Cardiovascular:     Rate and Rhythm: Normal rate and regular rhythm.     Pulses: Normal pulses.     Heart sounds: Normal heart sounds.  Pulmonary:     Effort: Pulmonary effort is normal.     Breath sounds: Normal breath sounds.  Musculoskeletal:     Cervical back: Normal range of motion.  Neurological:     General: No focal deficit present.     Mental Status: She is alert and oriented to person, place, and time. Mental status is at baseline.  Psychiatric:        Mood and Affect: Mood normal.        Behavior: Behavior normal.        Thought Content: Thought content normal.        Judgment: Judgment normal.          Results   Assessment & Plan:   Assessment and Plan Assessment & Plan Acute upper respiratory tract infection Symptoms began six days ago with sneezing, congestion, and a dry cough. No fever reported. Symptoms are improving, suggesting a viral etiology. Physical exam does not reveal any concerning findings. The condition appears to be self-limiting. - Continue hydration and use sugar-free cough drops as needed. - Consider Mucinex to thin secretions if coughing persists. -Advised patient on supportive measures:  Be sure to rest, drink plenty of fluids, and use tylenol  or ibuprofen as needed for pain. Follow up if fever >101, if symptoms worsen or if symptoms are not improved in 3 days. Patient verbalizes understanding.    Allergic rhinitis Chronic condition managed with Zyrtec. Symptoms include nasal congestion and pressure in the ears. Current medication may have reduced efficacy due  to prolonged use. - Switch from Zyrtec to Xyzal for better symptom control. - Continue using Flonase  nasal spray.        Return if symptoms worsen or fail to improve.     Ginger Patrick, MSN, APRN, FNP-C [facility]

## 2024-07-16 ENCOUNTER — Encounter: Payer: Self-pay | Admitting: Family

## 2024-07-22 NOTE — Telephone Encounter (Signed)
 Hey there  She messaged me last that she might have found something sooner in December I'm just waiting for her reply

## 2024-07-26 ENCOUNTER — Encounter: Payer: Self-pay | Admitting: Family

## 2024-08-28 ENCOUNTER — Encounter: Payer: Self-pay | Admitting: Family

## 2024-09-25 ENCOUNTER — Ambulatory Visit: Admission: EM | Admit: 2024-09-25 | Discharge: 2024-09-25 | Disposition: A | Discharge status: Home / Self Care

## 2024-09-25 ENCOUNTER — Encounter: Payer: Self-pay | Admitting: Emergency Medicine

## 2024-09-25 DIAGNOSIS — M778 Other enthesopathies, not elsewhere classified: Secondary | ICD-10-CM

## 2024-09-25 MED ORDER — PREDNISONE 10 MG (21) PO TBPK: ORAL_TABLET | Freq: Every day | ORAL | 0 refills | Status: AC

## 2024-09-25 MED ORDER — METHOCARBAMOL 500 MG PO TABS: 500.0000 mg | ORAL_TABLET | Freq: Two times a day (BID) | ORAL | 0 refills | Status: AC

## 2024-09-25 NOTE — ED Triage Notes (Signed)
 Patient reports playing with her son on yesterday and lifting arm and now complains of pain. Rates pain 9/10. Patient took regular medication with no relief.
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
## 2024-09-25 NOTE — Discharge Instructions (Addendum)
 Today you are evaluated for your arm pain based on where the pain is located and the description of your symptoms I do believe you have irritated the tendon following your triceps  Begin prednisone  every morning with food to reduce inflammation and help with pain, may take Tylenol  or any topical medicines additionally  May use muscle relaxer twice daily as needed for additional comfort  May use heat over the affected area in 10 to 15-minute intervals  May massage and stretch as tolerated, stretches inside of packet  May cushion the body with pillows whenever sitting and lying  If your symptoms continue to persist or worsen you may follow-up with urgent care or orthopedic specialist whose information is on from pain

## 2024-09-28 ENCOUNTER — Emergency Department
Admission: EM | Admit: 2024-09-28 | Discharge: 2024-09-29 | Disposition: A | Discharge status: Home / Self Care | Attending: Emergency Medicine | Admitting: Emergency Medicine

## 2024-09-28 ENCOUNTER — Encounter: Payer: Self-pay | Admitting: Emergency Medicine

## 2024-09-28 ENCOUNTER — Other Ambulatory Visit: Payer: Self-pay

## 2024-09-28 ENCOUNTER — Emergency Department

## 2024-09-28 DIAGNOSIS — D72829 Elevated white blood cell count, unspecified: Secondary | ICD-10-CM | POA: Insufficient documentation

## 2024-09-28 DIAGNOSIS — M542 Cervicalgia: Secondary | ICD-10-CM | POA: Insufficient documentation

## 2024-09-28 DIAGNOSIS — I10 Essential (primary) hypertension: Secondary | ICD-10-CM | POA: Insufficient documentation

## 2024-09-28 DIAGNOSIS — R002 Palpitations: Secondary | ICD-10-CM | POA: Insufficient documentation

## 2024-09-28 DIAGNOSIS — E039 Hypothyroidism, unspecified: Secondary | ICD-10-CM | POA: Diagnosis not present

## 2024-09-28 DIAGNOSIS — R42 Dizziness and giddiness: Secondary | ICD-10-CM | POA: Diagnosis not present

## 2024-09-28 LAB — URINALYSIS, ROUTINE W REFLEX MICROSCOPIC
Bilirubin Urine: NEGATIVE
Glucose, UA: NEGATIVE mg/dL
Ketones, ur: NEGATIVE mg/dL
Nitrite: NEGATIVE
Protein, ur: NEGATIVE mg/dL
Specific Gravity, Urine: 1.018 (ref 1.005–1.030)
pH: 5 (ref 5.0–8.0)

## 2024-09-28 LAB — BASIC METABOLIC PANEL WITH GFR
Anion gap: 13 (ref 5–15)
BUN: 23 mg/dL — ABNORMAL HIGH (ref 6–20)
CO2: 24 mmol/L (ref 22–32)
Calcium: 9 mg/dL (ref 8.9–10.3)
Chloride: 100 mmol/L (ref 98–111)
Creatinine, Ser: 0.83 mg/dL (ref 0.44–1.00)
GFR, Estimated: >60 mL/min
Glucose, Bld: 84 mg/dL (ref 70–99)
Potassium: 4.3 mmol/L (ref 3.5–5.1)
Sodium: 136 mmol/L (ref 135–145)

## 2024-09-28 LAB — CBC
HCT: 36 % (ref 36.0–46.0)
Hemoglobin: 11.4 g/dL — ABNORMAL LOW (ref 12.0–15.0)
MCH: 22.7 pg — ABNORMAL LOW (ref 26.0–34.0)
MCHC: 31.7 g/dL (ref 30.0–36.0)
MCV: 71.7 fL — ABNORMAL LOW (ref 80.0–100.0)
Platelets: 431 K/uL — ABNORMAL HIGH (ref 150–400)
RBC: 5.02 MIL/uL (ref 3.87–5.11)
RDW: 15.8 % — ABNORMAL HIGH (ref 11.5–15.5)
WBC: 11.9 K/uL — ABNORMAL HIGH (ref 4.0–10.5)
nRBC: 0 % (ref 0.0–0.2)

## 2024-09-28 LAB — CBG MONITORING, ED: Glucose-Capillary: 87 mg/dL (ref 70–99)

## 2024-09-28 LAB — POC URINE PREG, ED: Preg Test, Ur: NEGATIVE

## 2024-09-28 LAB — TROPONIN T, HIGH SENSITIVITY
Troponin T High Sensitivity: <15 ng/L (ref 0–19)
Troponin T High Sensitivity: <15 ng/L (ref 0–19)

## 2024-09-28 NOTE — ED Notes (Addendum)
 Pt's case discussed w/ Dicky, MD; no further orders needed. NO code stroke called at this time.

## 2024-09-28 NOTE — ED Triage Notes (Signed)
 Pt arrives POV reporting lightheadedness and palpitations that started around 1900. Pt reports generalized pins&needles all over body.pt states her husband reported her speech was off, speech is normal now per pt.  Pt states her brain feels foggy; denies pmh.  Pt also reports right sided neck pain that radiates into shoulder. Denies cp or sob. Denies weakness anywhere.

## 2024-09-29 LAB — MAGNESIUM: Magnesium: 1.9 mg/dL (ref 1.7–2.4)

## 2024-09-29 LAB — TSH: TSH: 4.21 u[IU]/mL (ref 0.350–4.500)

## 2024-09-29 LAB — D-DIMER, QUANTITATIVE: D-Dimer, Quant: 0.3 ug{FEU}/mL (ref 0.00–0.50)

## 2024-09-29 NOTE — ED Provider Notes (Signed)
 "  [facility] Provider Note    Event Date/Time   First MD Initiated Contact with Patient 09/28/24 2315     (approximate)   History   Palpitations and Tingling   HPI  Deborah Munoz is a 39 y.o. female with history of migraines, hypothyroidism, hypertension, obesity, gastric sleeve who presents to the emergency department with complaints of feeling Lightheaded Heart fluttering, palpitations 5-6 minutes Smart watch said a fib, 103 HR and 101 HR BP was 131/79 Came on at rest Speech was slurred Hard to form sentences Pain shooting down neck Whole body tingling x 20 minutes On way here felt something was in my chest like hung food but had not eaten No pressure No SOB Currently feeling better. No history of PE or DVT. No recent fevers or cough.   History provided by patient and family.    Past Medical History:  Diagnosis Date   History of gallstones    Hypertension    Hypothyroidism    Morbid obesity (HCC) 05/08/2012   Thyroid  disease     Past Surgical History:  Procedure Laterality Date   BARIATRIC SURGERY  03/27/2017   gastric sleeve   CHOLECYSTECTOMY     ESOPHAGOGASTRODUODENOSCOPY (EGD) WITH PROPOFOL  N/A 02/04/2017   Procedure: ESOPHAGOGASTRODUODENOSCOPY (EGD) WITH PROPOFOL ;  Surgeon: Jinny Carmine, MD;  Location: [facility] ENDOSCOPY;  Service: Endoscopy;  Laterality: N/A;   HAND SURGERY     injury 39 y/o   US  thyroid  non cyst fine needle aspiration       MEDICATIONS:  Prior to Admission medications  Medication Sig Start Date End Date Taking? Authorizing Provider  Cholecalciferol (VITAMIN D3) 2000 units capsule Take 1 capsule (2,000 Units total) by mouth daily. 11/24/17   Hinton Newell SQUIBB, MD  clobetasol  cream (TEMOVATE ) 0.05 % Apply 1 Application topically 2 (two) times daily. 07/01/24   Corwin Antu, FNP  EPINEPHrine  0.3 mg/0.3 mL IJ SOAJ injection Inject 0.3 mg into the muscle as needed for anaphylaxis (seafood/shelfish allergy).  07/01/24   Dugal, Tabitha, FNP  fluticasone  (FLONASE ) 50 MCG/ACT nasal spray SPRAY 2 SPRAYS INTO EACH NOSTRIL EVERY DAY 03/06/20   Tapia, Leisa, PA-C  gabapentin (NEURONTIN) 300 MG capsule Take 300 mg by mouth 3 (three) times daily.    [provider]  meloxicam  (MOBIC ) 15 MG tablet Take 1 tablet (15 mg total) by mouth daily as needed for pain. 07/01/24   Dugal, Tabitha, FNP  methocarbamol  (ROBAXIN ) 500 MG tablet Take 1 tablet (500 mg total) by mouth 2 (two) times daily. 09/25/24   Teresa Shelba SAUNDERS, NP  Multiple Vitamin (MULTIVITAMIN) tablet Take 1 tablet by mouth daily. Reported on 10/20/2015    [provider]  predniSONE  (STERAPRED UNI-PAK 21 TAB) 10 MG (21) TBPK tablet Take by mouth daily. Take 6 tabs by mouth daily  for 1 days, then 5 tabs for 1 days, then 4 tabs for 1 days, then 3 tabs for 1 days, 2 tabs for 1 days, then 1 tab by mouth daily for 1 days 09/25/24   Teresa Shelba SAUNDERS, NP    Physical Exam   Triage Vital Signs: ED Triage Vitals  Encounter Vitals Group     BP 09/28/24 1958 (!) 163/94     Girls Systolic BP Percentile --      Girls Diastolic BP Percentile --      Boys Systolic BP Percentile --      Boys Diastolic BP Percentile --      Pulse Rate 09/28/24 1958  91     Resp 09/28/24 1958 18     Temp 09/28/24 2206 98.4 F (36.9 C)     Temp Source 09/28/24 2206 Oral     SpO2 09/28/24 1958 100 %     Weight --      Height --      Head Circumference --      Peak Flow --      Pain Score 09/28/24 1956 3     Pain Loc --      Pain Education --      Exclude from Growth Chart --     Most recent vital signs: Vitals:   09/28/24 2206 09/28/24 2337  BP: (!) 151/103 (!) 154/80  Pulse: 97 84  Resp: 18 14  Temp: 98.4 F (36.9 C) 98.1 F (36.7 C)  SpO2: 100% 100%    CONSTITUTIONAL: Alert, responds appropriately to questions. Well-appearing; well-nourished HEAD: Normocephalic, atraumatic EYES: Conjunctivae clear, pupils appear equal, sclera nonicteric ENT: normal  nose; moist mucous membranes NECK: Supple, normal ROM, no midline spinal tenderness or step-off or deformity CARD: RRR; S1 and S2 appreciated RESP: Normal chest excursion without splinting or tachypnea; breath sounds clear and equal bilaterally; no wheezes, no rhonchi, no rales, no hypoxia or respiratory distress, speaking full sentences ABD/GI: Non-distended; soft, non-tender, no rebound, no guarding, no peritoneal signs BACK: The back appears normal EXT: Normal ROM in all joints; no deformity noted, no edema, no calf tenderness or calf swelling SKIN: Normal color for age and race; warm; no rash on exposed skin NEURO: Moves all extremities equally, normal speech, normal sensation diffusely, normal gait, no facial asymmetry PSYCH: The patient's mood and manner are appropriate.   ED Results / Procedures / Treatments   LABS: (all labs ordered are listed, but only abnormal results are displayed) Labs Reviewed  BASIC METABOLIC PANEL WITH GFR - Abnormal; Notable for the following components:      Result Value   BUN 23 (*)    All other components within normal limits  CBC - Abnormal; Notable for the following components:   WBC 11.9 (*)    Hemoglobin 11.4 (*)    MCV 71.7 (*)    MCH 22.7 (*)    RDW 15.8 (*)    Platelets 431 (*)    All other components within normal limits  URINALYSIS, ROUTINE W REFLEX MICROSCOPIC - Abnormal; Notable for the following components:   Color, Urine YELLOW (*)    APPearance HAZY (*)    Hgb urine dipstick LARGE (*)    Leukocytes,Ua TRACE (*)    Bacteria, UA RARE (*)    All other components within normal limits  MAGNESIUM  TSH  D-DIMER, QUANTITATIVE  CBG MONITORING, ED  POC URINE PREG, ED  TROPONIN T, HIGH SENSITIVITY  TROPONIN T, HIGH SENSITIVITY     EKG:  EKG Interpretation Date/Time:  Tuesday September 28 2024 19:59:31 EST Ventricular Rate:  95 PR Interval:  160 QRS Duration:  82 QT Interval:  354 QTC Calculation: 444 R Axis:   58  Text  Interpretation: Normal sinus rhythm Normal ECG When compared with ECG of 13-May-2022 15:41, No significant change was found Confirmed by Neomi Neptune [phone]) on 09/29/2024 12:15:40 AM         RADIOLOGY: My personal review and interpretation of imaging: Chest x-ray clear.  I have personally reviewed all radiology reports.   DG Chest 2 View Result Date: 09/28/2024 CLINICAL DATA:  Palpitations. EXAM: CHEST - 2 VIEW COMPARISON:  May 13, 2022 FINDINGS: The heart size and mediastinal contours are within normal limits. Both lungs are clear. The visualized skeletal structures are unremarkable. IMPRESSION: No active cardiopulmonary disease. Electronically Signed   By: Suzen Dials M.D.   On: 09/28/2024 20:27     PROCEDURES:  Critical Care performed: No     .1-3 Lead EKG Interpretation  Performed by: Soloman Mckeithan, Josette SAILOR, DO Authorized by: Christoher Drudge, Josette SAILOR, DO     Interpretation: normal     ECG rate:  84   ECG rate assessment: normal     Rhythm: sinus rhythm     Ectopy: none     Conduction: normal       IMPRESSION / MDM / ASSESSMENT AND PLAN / ED COURSE  I reviewed the triage vital signs and the nursing notes.    Patient here with complaints of lightheadedness, palpitations, neck pain, difficulty finding her words and mild slurred speech.  Symptoms now resolved.  States her smart watch was telling her she was in atrial fibrillation.  The patient is on the cardiac monitor to evaluate for evidence of arrhythmia and/or significant heart rate changes.   DIFFERENTIAL DIAGNOSIS (includes but not limited to):   Arrhythmia, anemia, electrolyte derangement, thyroid  dysfunction, dehydration, PE, less likely ACS, low suspicion for stroke.  Differential also includes anxiety, complex migraine.   Patient's presentation is most consistent with acute presentation with potential threat to life or bodily function.   PLAN: EKG shows no arrhythmia, ischemic change or interval abnormality.   Initial troponin negative.  Normal potassium.  Hemoglobin of 11.4.  Will check magnesium, TSH, D-dimer, pregnancy test.  Will place on cardiac monitoring.  Chest x-ray reviewed and interpreted by myself and radiologist and is clear.  Given this episode of speech changes, we discussed obtaining head imaging although my suspicion for TIA, stroke or intracranial hemorrhage is quite low.  Discussed risk of CT radiation and also discussed the possibility of obtaining MRI but I suspect both will be normal especially given she is asymptomatic.  She agrees to hold off at this time.  Currently neurologically intact.   MEDICATIONS GIVEN IN ED: Medications - No data to display   ED COURSE: Second troponin negative.  D-dimer negative.  Normal magnesium.  Negative pregnancy test.  No event seen on cardiac monitoring.  I feel she is safe to be discharged home and follow-up with cardiology as an outpatient.  She states she has worn Holter monitoring before due to palpitations.  Recommended increase water intake, avoiding stimulants and caffeine is much as possible.    At this time, I do not feel there is any life-threatening condition present. I reviewed all nursing notes, vitals, pertinent previous records.  All lab and urine results, EKGs, imaging ordered have been independently reviewed and interpreted by myself.  I reviewed all available radiology reports from any imaging ordered this visit.  Based on my assessment, I feel the patient is safe to be discharged home without further emergent workup and can continue workup as an outpatient as needed. Discussed all findings, treatment plan as well as usual and customary return precautions.  They verbalize understanding and are comfortable with this plan.  Outpatient follow-up has been provided as needed.  All questions have been answered.    CONSULTS:  none   OUTSIDE RECORDS REVIEWED: Reviewed last cardiology note with Dr. Florencio in 2024.       FINAL  CLINICAL IMPRESSION(S) / ED DIAGNOSES   Final diagnoses:  Palpitations  Rx / DC Orders   ED Discharge Orders          Ordered    Ambulatory referral to Cardiology       Comments: If you have not heard from the Cardiology office within the next 72 hours please call [phone].   09/29/24 0107             Note:  This document was prepared using Dragon voice recognition software and may include unintentional dictation errors.   Kinslie Hove, Josette SAILOR, DO 09/29/24 [id number]  "

## 2024-10-05 NOTE — Progress Notes (Signed)
 "  Office Visit Note  Patient: Deborah Munoz             Date of Birth: [birth year]           MRN: [id number]             PCP: Corwin Antu, FNP Referring: Corwin Antu, FNP Visit Date: 10/08/2024 Occupation: Data Unavailable  Subjective:  New Patient (Initial Visit) (Positive ANA, Elevated Sed Rate, Joint Pain, Rash)   History of Present Illness: Deborah Munoz is a 39 y.o. female    Discussed the use of AI scribe software for clinical note transcription with the patient, who gave verbal consent to proceed.  History of Present Illness Deborah Munoz is a 39 year old female who presents with chronic stiffness and fatigue. She was referred by her primary care physician for evaluation of chronic stiffness and fatigue.  She experiences chronic aches and pains with significant stiffness, particularly when getting up from a chair or after lying down. This stiffness has been present since at least 2018, starting from her hips and radiating upwards. Morning stiffness in her back lasts about ten minutes and improves with movement. Her back pain does not wake her at night unless she moves, and it worsens after prolonged sitting. She has a high pain tolerance and has adapted to her symptoms over time.  She experiences chronic fatigue, often feeling tired during the day and sometimes needing to nap multiple times. She has undergone four sleep studies since 2017, with the most recent one indicating she does not reach full REM sleep. This study was conducted in a facility last summer or early spring.  She has a history of rashes, including a persistent rash on her hand, initially thought to be eczema but possibly psoriasis, which improved with a cream prescribed by her primary care physician. She also had a rash between her shoulder blades from September to October, which was itchy and resolved spontaneously. She has a history of eczema since childhood, with a notable episode involving scales on her  legs.  She reports pain in her knees when her legs are elevated and notes that she was flexible as a child and has hyperextended knees. She was flexible as a child and has hyperextended knees, which may contribute to her joint pain. She also experiences random shooting pain in her pinky toe and swelling in her ankles, which does not improve with elevation.  She has a family history of psoriasis and Crohn's disease on her mother's side. Her maternal uncle has Crohn's disease, and her mother and siblings have psoriasis.  She is currently taking meloxicam  as needed for knee pain and has previously taken prednisone , which improved her back stiffness. She also takes creatine for muscle recovery and supplements recommended by a dietitian, including inositol and biorphen.  No sores in her mouth, dry eyes, dry mouth, breathing issues, painful rashes in the sun, or changes in hand color in the cold. She reports a history of dry eyes for three days in December and occasional headaches. She denies any significant muscle weakness.    Activities of Daily Living:  Patient reports morning stiffness for 10-15 minutes.   Patient Reports nocturnal pain.  Difficulty dressing/grooming: Denies Difficulty climbing stairs: Denies Difficulty getting out of chair: Reports Difficulty using hands for taps, buttons, cutlery, and/or writing: Denies  Review of Systems  Constitutional:  Positive for fatigue.  HENT:  Negative for mouth sores and mouth dryness.   Eyes:  Negative  for dryness.  Respiratory:  Positive for shortness of breath.   Cardiovascular:  Positive for chest pain and palpitations.  Gastrointestinal:  Negative for blood in stool, constipation and diarrhea.  Endocrine: Negative for increased urination.  Genitourinary:  Negative for involuntary urination.  Musculoskeletal:  Positive for joint pain, joint pain, joint swelling, myalgias, muscle weakness, morning stiffness, muscle tenderness and myalgias.  Negative for gait problem.  Skin:  Positive for rash. Negative for color change, hair loss and sensitivity to sunlight.  Allergic/Immunologic: Negative for susceptible to infections.  Neurological:  Positive for headaches. Negative for dizziness.  Hematological:  Positive for swollen glands.  Psychiatric/Behavioral:  Positive for sleep disturbance. Negative for depressed mood. The patient is not nervous/anxious.     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   Chronic pain of both knees 07/01/2024   Iron deficiency anemia due to chronic blood loss 07/01/2024   Polyarthralgia 07/01/2024   Psoriasis 07/01/2024   Thyroid  nodule 07/01/2024   Prediabetes 07/01/2024   History of hypertension 07/01/2024   Snoring 07/07/2023   Hypersomnia 07/07/2023   Headache disorder 11/24/2018   GERD (gastroesophageal reflux disease) 11/20/2018   Acanthosis nigricans 10/18/2016   High serum ferritin 10/18/2016   Adjustment disorder with anxiety 05/19/2016   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   RBC microcytosis 05/06/2015   Hypothyroidism 05/04/2015   Vitamin D  deficiency 05/04/2015   Dermatofibroma 03/29/2015   Adult ADHD 03/29/2015   Murmur, cardiac 05/08/2012   Morbid obesity (HCC) 05/08/2012    Past Medical History:  Diagnosis Date   History of gallstones    Hypertension    Hypothyroidism    Morbid obesity (HCC) 05/08/2012   Thyroid  disease     Family History  Problem Relation Age of Onset   Hypertension Mother    Diabetes Mother    Diabetes Father    Hypertension Father    Eczema Brother    Diabetes Maternal Grandmother    Hypertension Maternal Grandmother    Bone cancer Maternal Grandfather    Diabetes Paternal Grandmother    Hypertension Paternal Grandmother    Heart attack Paternal Grandmother    Anuerysm Paternal Grandfather    Heart disease Maternal Aunt     Heart disease Maternal Aunt    Crohn's disease Paternal Uncle    Past Surgical History:  Procedure Laterality Date   BARIATRIC SURGERY  03/27/2017   gastric sleeve   CHOLECYSTECTOMY     ESOPHAGOGASTRODUODENOSCOPY (EGD) WITH PROPOFOL  N/A 02/04/2017   Procedure: ESOPHAGOGASTRODUODENOSCOPY (EGD) WITH PROPOFOL ;  Surgeon: Jinny Carmine, MD;  Location: [facility] ENDOSCOPY;  Service: Endoscopy;  Laterality: N/A;   HAND SURGERY     injury 39 y/o   US  thyroid  non cyst fine needle aspiration      Social History[1] Social History   Social History Narrative   Two boys    Lives with husband and kiddos        Immunization History  Administered Date(s) Administered   Influenza,inj,Quad PF,6+ Mos 06/25/2017, 05/28/2019, 08/08/2020   Influenza-Unspecified 07/26/2015   PFIZER(Purple Top)SARS-COV-2 Vaccination 11/09/2019, 11/30/2019   PPD Test 06/10/2017, 09/08/2018, 01/25/2020   Tdap 06/25/2017     Objective: Vital Signs: BP 134/81 (BP Location: Right Arm, Patient Position: Sitting, Cuff Size: Large)   Pulse 90   Temp 97.6 F (36.4 C)   Resp 12   Ht 5' 2.75 (1.594 m)   Wt 285 lb  9.6 oz (129.5 kg)   LMP 09/30/2024 (Exact Date)   BMI 51.00 kg/m    Physical Exam Vitals and nursing note reviewed.  HENT:     Head: Normocephalic and atraumatic.     Nose: Nose normal.  Eyes:     Conjunctiva/sclera: Conjunctivae normal.     Pupils: Pupils are equal, round, and reactive to light.  Pulmonary:     Effort: Pulmonary effort is normal. No respiratory distress.  Skin:    General: Skin is warm and dry.     Comments: Mild hyperpigmented papular region b/l MCP's and MTP's  Neurological:     Mental Status: She is alert. Mental status is at baseline.  Psychiatric:        Mood and Affect: Mood normal.        Behavior: Behavior normal.      Musculoskeletal Exam: Beighton score for joint hypermobility: 6 Bilateral passive hyperextension of fingers, demonstrated by passive dorsiflexion of the  fifth metacarpophalangeal joint to at least 90 degrees Hyperextension of the bilateral elbows to at least 10 degrees Hyperextension of the bilateral knees to at least 10 degrees  CDAI Exam: CDAI Score: -- Patient Global: --; Provider Global: -- Swollen: 0 ; Tender: 2  Joint Exam 10/08/2024      Right  Left  Hip   Tender   Tender     Investigation: No additional findings.  Imaging: DG Chest 2 View Result Date: 09/28/2024 CLINICAL DATA:  Palpitations. EXAM: CHEST - 2 VIEW COMPARISON:  May 13, 2022 FINDINGS: The heart size and mediastinal contours are within normal limits. Both lungs are clear. The visualized skeletal structures are unremarkable. IMPRESSION: No active cardiopulmonary disease. Electronically Signed   By: Suzen Dials M.D.   On: 09/28/2024 20:27    Recent Labs: Lab Results  Component Value Date   WBC 11.9 (H) 09/28/2024   HGB 11.4 (L) 09/28/2024   PLT 431 (H) 09/28/2024   NA 136 09/28/2024   K 4.3 09/28/2024   CL 100 09/28/2024   CO2 24 09/28/2024   GLUCOSE 84 09/28/2024   BUN 23 (H) 09/28/2024   CREATININE 0.83 09/28/2024   BILITOT 0.4 05/13/2022   ALKPHOS 44 05/13/2022   AST 19 05/13/2022   ALT 12 05/13/2022   PROT 7.3 05/13/2022   ALBUMIN 3.9 05/13/2022   CALCIUM 9.0 09/28/2024   GFRAA 117 04/25/2020   QFTBGOLD NEGATIVE 06/17/2017    Speciality Comments: No specialty comments available.  Procedures:  No procedures performed Allergies: Shrimp [shellfish allergy] and Iodine   Assessment / Plan:     Visit Diagnoses: Inflammatory back pain - Patient with low back pain that has components that appear inflammatory in nature. Plan: DG HIPS BILAT WITH PELVIS 3-4 VIEWS, , DG Lumbar Spine 2-3 Views, HLA-B27 antigen. Will also repeat Sedimentation rate, C-reactive protein  Generalized hypermobility of joints Patient with hx of diffuse joint pain of b/l LE that appears to be secondary to significant hypermobility, especially of b/l  knees.  Discussed with patient that I do suspect hypermobility it contributing a large component to her joint pain. Discussed joint hypermobility is not an autoimmune disease, and that we do not have any medications to treat the condition. Discussed supportive care including physical therapy for joint strengthening/stabilization, with discussion regarding physical therapy held at this encounter. Also discussed with patient that medications including NSAIDs and acetaminophen  can be used as needed for joint symptoms.  Myalgia - Plan: CK, Aldolase  Other fatigue - Plan: Vitamin B12  Orders: Orders Placed This Encounter  Procedures   DG HIPS BILAT WITH PELVIS 3-4 VIEWS   DG Lumbar Spine 2-3 Views   Vitamin B12   HLA-B27 antigen   CK   Aldolase   Sedimentation rate   C-reactive protein   CK   Aldolase   HLA-B27 antigen   Sedimentation rate   C-reactive protein   No orders of the defined types were placed in this encounter.   I personally spent a total of 60 minutes in the care of the patient today including preparing to see the patient, getting/reviewing separately obtained history, performing a medically appropriate exam/evaluation, counseling and educating, placing orders, documenting clinical information in the EHR, independently interpreting results, and communicating results.   Follow-Up Instructions: Return in about 4 weeks (around 11/05/2024).   Asberry Claw, DO  Note - This record has been created using Animal nutritionist.  Chart creation errors have been sought, but may not always  have been located. Such creation errors do not reflect on  the standard of medical care.     [1]  Social History Tobacco Use   Smoking status: Never    Passive exposure: Never   Smokeless tobacco: Never  Vaping Use   Vaping status: Never Used  Substance Use Topics   Alcohol use: Yes    Alcohol/week: 0.0 standard drinks of alcohol    Comment: occasionally   Drug use: No   "

## 2024-10-08 ENCOUNTER — Ambulatory Visit

## 2024-10-08 VITALS — BP 134/81 | HR 90 | Temp 97.6°F | Resp 12 | Ht 62.75 in | Wt 285.6 lb

## 2024-10-08 DIAGNOSIS — M5489 Other dorsalgia: Secondary | ICD-10-CM | POA: Diagnosis not present

## 2024-10-08 DIAGNOSIS — M791 Myalgia, unspecified site: Secondary | ICD-10-CM

## 2024-10-08 DIAGNOSIS — R21 Rash and other nonspecific skin eruption: Secondary | ICD-10-CM | POA: Diagnosis not present

## 2024-10-08 DIAGNOSIS — R5383 Other fatigue: Secondary | ICD-10-CM | POA: Diagnosis not present

## 2024-10-08 NOTE — Patient Instructions (Signed)
 [facility] [address] [place], [place] [address] [phone]

## 2024-10-12 LAB — HLA-B27 ANTIGEN: HLA-B27 Antigen: NEGATIVE

## 2024-10-12 LAB — ALDOLASE: Aldolase: 4 U/L

## 2024-10-12 LAB — CK: Total CK: 124 U/L (ref 20–239)

## 2024-10-12 LAB — C-REACTIVE PROTEIN: CRP: 14.7 mg/L — ABNORMAL HIGH

## 2024-10-12 LAB — VITAMIN B12: Vitamin B-12: 544 pg/mL (ref 200–1100)

## 2024-10-12 LAB — SEDIMENTATION RATE: Sed Rate: 19 mm/h (ref 0–20)

## 2024-10-29 NOTE — Progress Notes (Unsigned)
 "  Office Visit Note  Patient: Deborah Munoz             Date of Birth: 1986-07-11           MRN: [id number]             PCP: Corwin Antu, FNP Referring: Corwin Antu, FNP Visit Date: 11/11/2024 Occupation: Data Unavailable  Subjective:  No chief complaint on file.   History of Present Illness: Deborah Munoz is a 39 y.o. female with Inflammatory back pain, generalized hypermobility of joints, myalgia and fatigue who is presenting for a 4 week follow up. She was last seen on 10/08/2024 where labs and x-rays were ordered.     Activities of Daily Living:  Patient reports morning stiffness for *** {minute/hour:19697}.   Patient {ACTIONS;DENIES/REPORTS:21021675::Denies} nocturnal pain.  Difficulty dressing/grooming: {ACTIONS;DENIES/REPORTS:21021675::Denies} Difficulty climbing stairs: {ACTIONS;DENIES/REPORTS:21021675::Denies} Difficulty getting out of chair: {ACTIONS;DENIES/REPORTS:21021675::Denies} Difficulty using hands for taps, buttons, cutlery, and/or writing: {ACTIONS;DENIES/REPORTS:21021675::Denies}  No Rheumatology ROS completed.   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   Chronic pain of both knees 07/01/2024   Iron deficiency anemia due to chronic blood loss 07/01/2024   Polyarthralgia 07/01/2024   Psoriasis 07/01/2024   Thyroid  nodule 07/01/2024   Prediabetes 07/01/2024   History of hypertension 07/01/2024   Snoring 07/07/2023   Hypersomnia 07/07/2023   Headache disorder 11/24/2018   GERD (gastroesophageal reflux disease) 11/20/2018   Acanthosis nigricans 10/18/2016   High serum ferritin 10/18/2016   Adjustment disorder with anxiety 05/19/2016   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   RBC microcytosis 05/06/2015   Hypothyroidism 05/04/2015   Vitamin D  deficiency 05/04/2015   Dermatofibroma 03/29/2015   Adult ADHD 03/29/2015    Murmur, cardiac 05/08/2012   Morbid obesity (HCC) 05/08/2012    Past Medical History:  Diagnosis Date   History of gallstones    Hypertension    Hypothyroidism    Morbid obesity (HCC) 05/08/2012   Thyroid  disease     Family History  Problem Relation Age of Onset   Hypertension Mother    Diabetes Mother    Diabetes Father    Hypertension Father    Eczema Brother    Diabetes Maternal Grandmother    Hypertension Maternal Grandmother    Bone cancer Maternal Grandfather    Diabetes Paternal Grandmother    Hypertension Paternal Grandmother    Heart attack Paternal Grandmother    Anuerysm Paternal Grandfather    Heart disease Maternal Aunt    Heart disease Maternal Aunt    Crohn's disease Paternal Uncle    Past Surgical History:  Procedure Laterality Date   BARIATRIC SURGERY  03/27/2017   gastric sleeve   CHOLECYSTECTOMY     ESOPHAGOGASTRODUODENOSCOPY (EGD) WITH PROPOFOL  N/A 02/04/2017   Procedure: ESOPHAGOGASTRODUODENOSCOPY (EGD) WITH PROPOFOL ;  Surgeon: Jinny Carmine, MD;  Location: [facility];  Service: Endoscopy;  Laterality: N/A;   HAND SURGERY     injury 39 y/o   US  thyroid  non cyst fine needle aspiration      Social History[1] Social History   Social History Narrative   Two boys    Lives with husband and kiddos        Immunization History  Administered Date(s) Administered   Influenza,inj,Quad PF,6+ Mos 06/25/2017, 05/28/2019, 08/08/2020   Influenza-Unspecified 07/26/2015   PFIZER(Purple Top)SARS-COV-2 Vaccination 11/09/2019, 11/30/2019   PPD Test 06/10/2017, 09/08/2018, 01/25/2020   Tdap 06/25/2017  Objective: Vital Signs: There were no vitals taken for this visit.   Physical Exam   Musculoskeletal Exam: ***  CDAI Exam: CDAI Score: -- Patient Global: --; Provider Global: -- Swollen: --; Tender: -- Joint Exam 11/11/2024   No joint exam has been documented for this visit   There is currently no information documented on the homunculus. Go  to the Rheumatology activity and complete the homunculus joint exam.  Investigation: No additional findings.  Imaging: No results found.  Recent Labs: Lab Results  Component Value Date   WBC 11.9 (H) 09/28/2024   HGB 11.4 (L) 09/28/2024   PLT 431 (H) 09/28/2024   NA 136 09/28/2024   K 4.3 09/28/2024   CL 100 09/28/2024   CO2 24 09/28/2024   GLUCOSE 84 09/28/2024   BUN 23 (H) 09/28/2024   CREATININE 0.83 09/28/2024   BILITOT 0.4 05/13/2022   ALKPHOS 44 05/13/2022   AST 19 05/13/2022   ALT 12 05/13/2022   PROT 7.3 05/13/2022   ALBUMIN 3.9 05/13/2022   CALCIUM 9.0 09/28/2024   GFRAA 117 04/25/2020   QFTBGOLD NEGATIVE 06/17/2017    Speciality Comments: No specialty comments available.  Procedures:  No procedures performed Allergies: Shrimp [shellfish allergy] and Iodine   Assessment / Plan:     Visit Diagnoses: No diagnosis found.  Orders: No orders of the defined types were placed in this encounter.  No orders of the defined types were placed in this encounter.   Face-to-face time spent with patient was *** minutes. Greater than 50% of time was spent in counseling and coordination of care.  Follow-Up Instructions: No follow-ups on file.   Alfonso Patterson, LPN  Note - This record has been created using [employer].  Chart creation errors have been sought, but may not always  have been located. Such creation errors do not reflect on  the standard of medical care.    [1]  Social History Tobacco Use   Smoking status: Never    Passive exposure: Never   Smokeless tobacco: Never  Vaping Use   Vaping status: Never Used  Substance Use Topics   Alcohol use: Yes    Alcohol/week: 0.0 standard drinks of alcohol    Comment: occasionally   Drug use: No   "

## 2024-11-11 ENCOUNTER — Ambulatory Visit

## 2024-11-11 DIAGNOSIS — M5489 Other dorsalgia: Secondary | ICD-10-CM

## 2024-11-11 DIAGNOSIS — R5383 Other fatigue: Secondary | ICD-10-CM

## 2024-11-11 DIAGNOSIS — M791 Myalgia, unspecified site: Secondary | ICD-10-CM
